# Patient Record
Sex: Female | Born: 1940
Health system: Southern US, Community
[De-identification: ages and names within clinical notes are randomized; demographics above are authoritative.]

## PROBLEM LIST (undated history)

## (undated) DIAGNOSIS — K5900 Constipation, unspecified: Secondary | ICD-10-CM

## (undated) DIAGNOSIS — M549 Dorsalgia, unspecified: Secondary | ICD-10-CM

## (undated) DIAGNOSIS — Z9889 Other specified postprocedural states: Secondary | ICD-10-CM

## (undated) DIAGNOSIS — R350 Frequency of micturition: Secondary | ICD-10-CM

## (undated) DIAGNOSIS — Z8669 Personal history of other diseases of the nervous system and sense organs: Secondary | ICD-10-CM

## (undated) DIAGNOSIS — R112 Nausea with vomiting, unspecified: Secondary | ICD-10-CM

## (undated) DIAGNOSIS — M62838 Other muscle spasm: Secondary | ICD-10-CM

## (undated) DIAGNOSIS — G4700 Insomnia, unspecified: Secondary | ICD-10-CM

## (undated) DIAGNOSIS — G8929 Other chronic pain: Secondary | ICD-10-CM

## (undated) DIAGNOSIS — E785 Hyperlipidemia, unspecified: Secondary | ICD-10-CM

## (undated) DIAGNOSIS — F419 Anxiety disorder, unspecified: Secondary | ICD-10-CM

## (undated) DIAGNOSIS — E039 Hypothyroidism, unspecified: Secondary | ICD-10-CM

## (undated) HISTORY — DX: Anxiety disorder, unspecified: F41.9

## (undated) HISTORY — PX: TUBAL LIGATION: SHX77

## (undated) HISTORY — PX: THYROIDECTOMY, PARTIAL: SHX18

## (undated) HISTORY — PX: COLONOSCOPY: SHX174

---

## 2004-11-01 ENCOUNTER — Ambulatory Visit: Payer: Self-pay | Admitting: Family Medicine

## 2005-11-02 ENCOUNTER — Ambulatory Visit: Payer: Self-pay | Admitting: Family Medicine

## 2006-11-22 ENCOUNTER — Ambulatory Visit: Payer: Self-pay | Admitting: Family Medicine

## 2007-11-27 ENCOUNTER — Ambulatory Visit: Payer: Self-pay | Admitting: Family Medicine

## 2008-02-03 ENCOUNTER — Ambulatory Visit: Payer: Self-pay | Admitting: Family Medicine

## 2008-12-21 ENCOUNTER — Ambulatory Visit: Payer: Self-pay | Admitting: Family Medicine

## 2009-12-23 ENCOUNTER — Ambulatory Visit: Payer: Self-pay

## 2010-05-03 ENCOUNTER — Ambulatory Visit: Payer: Self-pay | Admitting: Gastroenterology

## 2010-12-26 ENCOUNTER — Ambulatory Visit: Payer: Self-pay | Admitting: Family Medicine

## 2012-01-25 ENCOUNTER — Ambulatory Visit: Payer: Self-pay

## 2012-03-07 ENCOUNTER — Emergency Department: Payer: Self-pay | Admitting: *Deleted

## 2012-03-07 LAB — URINALYSIS, COMPLETE
Blood: NEGATIVE
Glucose,UR: NEGATIVE mg/dL (ref 0–75)
Nitrite: NEGATIVE
WBC UR: 6 /HPF (ref 0–5)

## 2012-03-14 ENCOUNTER — Encounter (HOSPITAL_COMMUNITY): Payer: Self-pay | Admitting: Pharmacy Technician

## 2012-03-15 ENCOUNTER — Other Ambulatory Visit: Payer: Self-pay | Admitting: Neurosurgery

## 2012-03-18 ENCOUNTER — Encounter (HOSPITAL_COMMUNITY): Payer: Self-pay | Admitting: *Deleted

## 2012-03-18 MED ORDER — DEXAMETHASONE SODIUM PHOSPHATE 10 MG/ML IJ SOLN
10.0000 mg | INTRAMUSCULAR | Status: AC
  Administered 2012-03-19: 10 mg via INTRAVENOUS
  Filled 2012-03-18: qty 1

## 2012-03-18 MED ORDER — CEFAZOLIN SODIUM 1-5 GM-% IV SOLN
1.0000 g | INTRAVENOUS | Status: AC
  Administered 2012-03-19: 1 g via INTRAVENOUS
  Filled 2012-03-18: qty 50

## 2012-03-18 NOTE — Progress Notes (Signed)
Pt doesn't have a cardiologist  Denies ever having an echo/stress test/ heart cath   Medical MD is Dr.Moffitt with [facility](MD just left and new MD will be Dr.Fitzgerald but pt hasnt even seen him yet)

## 2012-03-19 ENCOUNTER — Ambulatory Visit (HOSPITAL_COMMUNITY): Payer: Medicare Other | Admitting: Certified Registered Nurse Anesthetist

## 2012-03-19 ENCOUNTER — Encounter (HOSPITAL_COMMUNITY): Payer: Self-pay | Admitting: Certified Registered Nurse Anesthetist

## 2012-03-19 ENCOUNTER — Ambulatory Visit (HOSPITAL_COMMUNITY): Payer: Medicare Other

## 2012-03-19 ENCOUNTER — Inpatient Hospital Stay (HOSPITAL_COMMUNITY)
Admission: RE | Admit: 2012-03-19 | Discharge: 2012-03-20 | DRG: 491 | Disposition: A | Discharge status: Home / Self Care | Payer: Medicare Other | Source: Ambulatory Visit | Attending: Neurosurgery | Admitting: Neurosurgery

## 2012-03-19 ENCOUNTER — Encounter (HOSPITAL_COMMUNITY)
Admission: RE | Disposition: A | Discharge status: Home / Self Care | Payer: Self-pay | Source: Ambulatory Visit | Attending: Neurosurgery

## 2012-03-19 ENCOUNTER — Encounter (HOSPITAL_COMMUNITY): Payer: Self-pay | Admitting: *Deleted

## 2012-03-19 DIAGNOSIS — E785 Hyperlipidemia, unspecified: Secondary | ICD-10-CM | POA: Diagnosis present

## 2012-03-19 DIAGNOSIS — Z87891 Personal history of nicotine dependence: Secondary | ICD-10-CM

## 2012-03-19 DIAGNOSIS — M48062 Spinal stenosis, lumbar region with neurogenic claudication: Principal | ICD-10-CM | POA: Diagnosis present

## 2012-03-19 DIAGNOSIS — G4700 Insomnia, unspecified: Secondary | ICD-10-CM | POA: Diagnosis present

## 2012-03-19 DIAGNOSIS — R350 Frequency of micturition: Secondary | ICD-10-CM | POA: Diagnosis present

## 2012-03-19 DIAGNOSIS — E039 Hypothyroidism, unspecified: Secondary | ICD-10-CM | POA: Diagnosis present

## 2012-03-19 HISTORY — DX: Other chronic pain: G89.29

## 2012-03-19 HISTORY — DX: Frequency of micturition: R35.0

## 2012-03-19 HISTORY — DX: Constipation, unspecified: K59.00

## 2012-03-19 HISTORY — DX: Dorsalgia, unspecified: M54.9

## 2012-03-19 HISTORY — PX: LUMBAR LAMINECTOMY/DECOMPRESSION MICRODISCECTOMY: SHX5026

## 2012-03-19 HISTORY — DX: Personal history of other diseases of the nervous system and sense organs: Z86.69

## 2012-03-19 HISTORY — DX: Hypothyroidism, unspecified: E03.9

## 2012-03-19 HISTORY — DX: Nausea with vomiting, unspecified: Z98.890

## 2012-03-19 HISTORY — DX: Hyperlipidemia, unspecified: E78.5

## 2012-03-19 HISTORY — DX: Insomnia, unspecified: G47.00

## 2012-03-19 HISTORY — DX: Other muscle spasm: M62.838

## 2012-03-19 HISTORY — DX: Nausea with vomiting, unspecified: R11.2

## 2012-03-19 LAB — DIFFERENTIAL
Eosinophils Absolute: 0.1 10*3/uL (ref 0.0–0.7)
Eosinophils Relative: 1 % (ref 0–5)
Lymphs Abs: 2.8 10*3/uL (ref 0.7–4.0)
Monocytes Relative: 9 % (ref 3–12)

## 2012-03-19 LAB — BASIC METABOLIC PANEL
BUN: 9 mg/dL (ref 6–23)
Calcium: 9.6 mg/dL (ref 8.4–10.5)
Creatinine, Ser: 0.78 mg/dL (ref 0.50–1.10)
GFR calc Af Amer: >90 mL/min (ref >90)
GFR calc non Af Amer: 83 mL/min — ABNORMAL LOW (ref >90)
Potassium: 4.5 mEq/L (ref 3.5–5.1)

## 2012-03-19 LAB — CBC
MCH: 30.3 pg (ref 26.0–34.0)
MCHC: 33.9 g/dL (ref 30.0–36.0)
MCV: 89.6 fL (ref 78.0–100.0)
Platelets: 274 10*3/uL (ref 150–400)
RDW: 13.5 % (ref 11.5–15.5)

## 2012-03-19 SURGERY — LUMBAR LAMINECTOMY/DECOMPRESSION MICRODISCECTOMY 2 LEVELS
Anesthesia: General | Site: Back | Laterality: Bilateral

## 2012-03-19 MED ORDER — BACITRACIN 50000 UNITS IM SOLR
INTRAMUSCULAR | Status: AC
  Filled 2012-03-19: qty 1

## 2012-03-19 MED ORDER — OXYCODONE-ACETAMINOPHEN 5-325 MG PO TABS: 1.0000 | ORAL_TABLET | ORAL | Status: DC | PRN

## 2012-03-19 MED ORDER — PHENYLEPHRINE HCL 10 MG/ML IJ SOLN
INTRAMUSCULAR | Status: DC | PRN
  Administered 2012-03-19 (×2): 40 ug via INTRAVENOUS

## 2012-03-19 MED ORDER — POLYETHYLENE GLYCOL 3350 17 G PO PACK
17.0000 g | PACK | Freq: Every day | ORAL | Status: DC | PRN
  Filled 2012-03-19: qty 1

## 2012-03-19 MED ORDER — KETOROLAC TROMETHAMINE 30 MG/ML IJ SOLN
INTRAMUSCULAR | Status: DC | PRN
  Administered 2012-03-19: 30 mg via INTRAVENOUS

## 2012-03-19 MED ORDER — MUPIROCIN 2 % EX OINT
TOPICAL_OINTMENT | CUTANEOUS | Status: AC
  Administered 2012-03-19: 1 via NASAL
  Filled 2012-03-19: qty 22

## 2012-03-19 MED ORDER — SODIUM CHLORIDE 0.9 % IJ SOLN
3.0000 mL | Freq: Two times a day (BID) | INTRAMUSCULAR | Status: DC
  Administered 2012-03-19: 3 mL via INTRAVENOUS

## 2012-03-19 MED ORDER — KETOROLAC TROMETHAMINE 30 MG/ML IJ SOLN
30.0000 mg | Freq: Four times a day (QID) | INTRAMUSCULAR | Status: DC
  Administered 2012-03-19 – 2012-03-20 (×2): 30 mg via INTRAVENOUS
  Filled 2012-03-19 (×6): qty 1

## 2012-03-19 MED ORDER — VECURONIUM BROMIDE 10 MG IV SOLR
INTRAVENOUS | Status: DC | PRN
  Administered 2012-03-19: 2 mg via INTRAVENOUS

## 2012-03-19 MED ORDER — HYDROMORPHONE HCL PF 1 MG/ML IJ SOLN
INTRAMUSCULAR | Status: AC
  Administered 2012-03-19: 0.5 mg via INTRAVENOUS
  Filled 2012-03-19: qty 1

## 2012-03-19 MED ORDER — ONDANSETRON HCL 4 MG/2ML IJ SOLN
INTRAMUSCULAR | Status: DC | PRN
  Administered 2012-03-19: 4 mg via INTRAVENOUS

## 2012-03-19 MED ORDER — METHOCARBAMOL 500 MG PO TABS: 500.0000 mg | ORAL_TABLET | Freq: Three times a day (TID) | ORAL | Status: DC | PRN

## 2012-03-19 MED ORDER — FENTANYL CITRATE 0.05 MG/ML IJ SOLN
INTRAMUSCULAR | Status: DC | PRN
  Administered 2012-03-19 (×2): 50 ug via INTRAVENOUS
  Administered 2012-03-19: 100 ug via INTRAVENOUS
  Administered 2012-03-19: 50 ug via INTRAVENOUS

## 2012-03-19 MED ORDER — LIDOCAINE HCL (CARDIAC) 20 MG/ML IV SOLN
INTRAVENOUS | Status: DC | PRN
  Administered 2012-03-19: 70 mg via INTRAVENOUS
  Administered 2012-03-19: 30 mg via INTRAVENOUS

## 2012-03-19 MED ORDER — SODIUM CHLORIDE 0.9 % IR SOLN
Status: DC | PRN
  Administered 2012-03-19: 16:00:00

## 2012-03-19 MED ORDER — MELOXICAM 7.5 MG PO TABS: 7.5000 mg | ORAL_TABLET | Freq: Every day | ORAL | Status: DC

## 2012-03-19 MED ORDER — BISACODYL 10 MG RE SUPP: 10.0000 mg | Freq: Every day | RECTAL | Status: DC | PRN

## 2012-03-19 MED ORDER — THROMBIN 5000 UNITS EX KIT
PACK | CUTANEOUS | Status: DC | PRN
  Administered 2012-03-19 (×2): 5000 [IU] via TOPICAL

## 2012-03-19 MED ORDER — CYCLOBENZAPRINE HCL 10 MG PO TABS: 10.0000 mg | ORAL_TABLET | Freq: Three times a day (TID) | ORAL | Status: DC | PRN

## 2012-03-19 MED ORDER — CEFAZOLIN SODIUM 1-5 GM-% IV SOLN
1.0000 g | Freq: Three times a day (TID) | INTRAVENOUS | Status: AC
  Administered 2012-03-19 – 2012-03-20 (×2): 1 g via INTRAVENOUS
  Filled 2012-03-19 (×3): qty 50

## 2012-03-19 MED ORDER — HYDROCODONE-ACETAMINOPHEN 5-325 MG PO TABS: 1.0000 | ORAL_TABLET | ORAL | Status: DC | PRN

## 2012-03-19 MED ORDER — SENNA 8.6 MG PO TABS
1.0000 | ORAL_TABLET | Freq: Two times a day (BID) | ORAL | Status: DC
  Administered 2012-03-19: 8.6 mg via ORAL
  Filled 2012-03-19 (×3): qty 1

## 2012-03-19 MED ORDER — SODIUM CHLORIDE 0.9 % IV SOLN: 250.0000 mL | INTRAVENOUS | Status: DC

## 2012-03-19 MED ORDER — HEMOSTATIC AGENTS (NO CHARGE) OPTIME
TOPICAL | Status: DC | PRN
  Administered 2012-03-19: 1 via TOPICAL

## 2012-03-19 MED ORDER — ZOLPIDEM TARTRATE 5 MG PO TABS: 5.0000 mg | ORAL_TABLET | Freq: Every evening | ORAL | Status: DC | PRN

## 2012-03-19 MED ORDER — HYDROMORPHONE HCL PF 1 MG/ML IJ SOLN: 0.5000 mg | INTRAMUSCULAR | Status: DC | PRN

## 2012-03-19 MED ORDER — PROPOFOL 10 MG/ML IV EMUL
INTRAVENOUS | Status: DC | PRN
  Administered 2012-03-19: 10 mg via INTRAVENOUS
  Administered 2012-03-19: 150 mg via INTRAVENOUS

## 2012-03-19 MED ORDER — ACETAMINOPHEN 650 MG RE SUPP: 650.0000 mg | RECTAL | Status: DC | PRN

## 2012-03-19 MED ORDER — SIMVASTATIN 20 MG PO TABS
20.0000 mg | ORAL_TABLET | Freq: Every evening | ORAL | Status: DC
  Administered 2012-03-19: 20 mg via ORAL
  Filled 2012-03-19 (×3): qty 1

## 2012-03-19 MED ORDER — 0.9 % SODIUM CHLORIDE (POUR BTL) OPTIME
TOPICAL | Status: DC | PRN
  Administered 2012-03-19: 1000 mL

## 2012-03-19 MED ORDER — ACETAMINOPHEN 325 MG PO TABS: 650.0000 mg | ORAL_TABLET | ORAL | Status: DC | PRN

## 2012-03-19 MED ORDER — PHENOL 1.4 % MT LIQD: 1.0000 | OROMUCOSAL | Status: DC | PRN

## 2012-03-19 MED ORDER — LEVOTHYROXINE SODIUM 88 MCG PO TABS
88.0000 ug | ORAL_TABLET | Freq: Every day | ORAL | Status: DC
  Administered 2012-03-20: 88 ug via ORAL
  Filled 2012-03-19 (×2): qty 1

## 2012-03-19 MED ORDER — ONDANSETRON HCL 4 MG/2ML IJ SOLN: 4.0000 mg | INTRAMUSCULAR | Status: DC | PRN

## 2012-03-19 MED ORDER — ROCURONIUM BROMIDE 100 MG/10ML IV SOLN
INTRAVENOUS | Status: DC | PRN
  Administered 2012-03-19: 50 mg via INTRAVENOUS

## 2012-03-19 MED ORDER — SODIUM CHLORIDE 0.9 % IV SOLN
INTRAVENOUS | Status: AC
  Filled 2012-03-19: qty 500

## 2012-03-19 MED ORDER — MIDAZOLAM HCL 5 MG/5ML IJ SOLN
INTRAMUSCULAR | Status: DC | PRN
  Administered 2012-03-19: 0.5 mg via INTRAVENOUS
  Administered 2012-03-19: 1 mg via INTRAVENOUS

## 2012-03-19 MED ORDER — HYDROMORPHONE HCL PF 1 MG/ML IJ SOLN
0.2500 mg | INTRAMUSCULAR | Status: DC | PRN
  Administered 2012-03-19 (×3): 0.5 mg via INTRAVENOUS

## 2012-03-19 MED ORDER — FLEET ENEMA 7-19 GM/118ML RE ENEM
1.0000 | ENEMA | Freq: Once | RECTAL | Status: AC | PRN
  Filled 2012-03-19: qty 1

## 2012-03-19 MED ORDER — LACTATED RINGERS IV SOLN
INTRAVENOUS | Status: DC | PRN
  Administered 2012-03-19 (×2): via INTRAVENOUS

## 2012-03-19 MED ORDER — SODIUM CHLORIDE 0.9 % IJ SOLN: 3.0000 mL | INTRAMUSCULAR | Status: DC | PRN

## 2012-03-19 MED ORDER — ALUM & MAG HYDROXIDE-SIMETH 200-200-20 MG/5ML PO SUSP: 30.0000 mL | Freq: Four times a day (QID) | ORAL | Status: DC | PRN

## 2012-03-19 MED ORDER — MENTHOL 3 MG MT LOZG: 1.0000 | LOZENGE | OROMUCOSAL | Status: DC | PRN

## 2012-03-19 MED ORDER — BUPIVACAINE HCL (PF) 0.25 % IJ SOLN
INTRAMUSCULAR | Status: DC | PRN
  Administered 2012-03-19: 20 mL

## 2012-03-19 MED ORDER — NEOSTIGMINE METHYLSULFATE 1 MG/ML IJ SOLN
INTRAMUSCULAR | Status: DC | PRN
  Administered 2012-03-19: 3 mg via INTRAVENOUS

## 2012-03-19 MED ORDER — ONDANSETRON HCL 4 MG/2ML IJ SOLN: 4.0000 mg | Freq: Four times a day (QID) | INTRAMUSCULAR | Status: DC | PRN

## 2012-03-19 MED ORDER — GLYCOPYRROLATE 0.2 MG/ML IJ SOLN
INTRAMUSCULAR | Status: DC | PRN
  Administered 2012-03-19: .5 mg via INTRAVENOUS

## 2012-03-19 SURGICAL SUPPLY — 46 items
BAG DECANTER FOR FLEXI CONT (MISCELLANEOUS) ×2 IMPLANT
BENZOIN TINCTURE PRP APPL 2/3 (GAUZE/BANDAGES/DRESSINGS) ×2 IMPLANT
BLADE SURG ROTATE 9660 (MISCELLANEOUS) IMPLANT
BRUSH SCRUB EZ PLAIN DRY (MISCELLANEOUS) ×2 IMPLANT
BUR CUTTER 7.0 ROUND (BURR) ×2 IMPLANT
CANISTER SUCTION 2500CC (MISCELLANEOUS) ×2 IMPLANT
CLOTH BEACON ORANGE TIMEOUT ST (SAFETY) ×2 IMPLANT
CONT SPEC 4OZ CLIKSEAL STRL BL (MISCELLANEOUS) ×2 IMPLANT
DECANTER SPIKE VIAL GLASS SM (MISCELLANEOUS) ×2 IMPLANT
DERMABOND ADVANCED (GAUZE/BANDAGES/DRESSINGS) ×1
DERMABOND ADVANCED .7 DNX12 (GAUZE/BANDAGES/DRESSINGS) ×1 IMPLANT
DRAPE LAPAROTOMY 100X72X124 (DRAPES) ×2 IMPLANT
DRAPE MICROSCOPE ZEISS OPMI (DRAPES) ×2 IMPLANT
DRAPE POUCH INSTRU U-SHP 10X18 (DRAPES) ×2 IMPLANT
DRAPE PROXIMA HALF (DRAPES) IMPLANT
DRAPE SURG 17X23 STRL (DRAPES) ×4 IMPLANT
ELECT REM PT RETURN 9FT ADLT (ELECTROSURGICAL) ×2
ELECTRODE REM PT RTRN 9FT ADLT (ELECTROSURGICAL) ×1 IMPLANT
EVACUATOR 1/8 PVC DRAIN (DRAIN) ×2 IMPLANT
GAUZE SPONGE 4X4 16PLY XRAY LF (GAUZE/BANDAGES/DRESSINGS) IMPLANT
GLOVE ECLIPSE 8.5 STRL (GLOVE) ×2 IMPLANT
GLOVE EXAM NITRILE LRG STRL (GLOVE) IMPLANT
GLOVE EXAM NITRILE MD LF STRL (GLOVE) IMPLANT
GLOVE EXAM NITRILE XL STR (GLOVE) IMPLANT
GLOVE EXAM NITRILE XS STR PU (GLOVE) IMPLANT
GOWN BRE IMP SLV AUR LG STRL (GOWN DISPOSABLE) IMPLANT
GOWN BRE IMP SLV AUR XL STRL (GOWN DISPOSABLE) ×2 IMPLANT
GOWN STRL REIN 2XL LVL4 (GOWN DISPOSABLE) IMPLANT
KIT BASIN OR (CUSTOM PROCEDURE TRAY) ×2 IMPLANT
KIT ROOM TURNOVER OR (KITS) ×2 IMPLANT
NEEDLE HYPO 22GX1.5 SAFETY (NEEDLE) ×2 IMPLANT
NEEDLE SPNL 22GX3.5 QUINCKE BK (NEEDLE) ×2 IMPLANT
NS IRRIG 1000ML POUR BTL (IV SOLUTION) ×2 IMPLANT
PACK LAMINECTOMY NEURO (CUSTOM PROCEDURE TRAY) ×2 IMPLANT
PAD ARMBOARD 7.5X6 YLW CONV (MISCELLANEOUS) ×6 IMPLANT
RUBBERBAND STERILE (MISCELLANEOUS) ×4 IMPLANT
SPONGE GAUZE 4X4 12PLY (GAUZE/BANDAGES/DRESSINGS) ×2 IMPLANT
SPONGE SURGIFOAM ABS GEL SZ50 (HEMOSTASIS) ×2 IMPLANT
STRIP CLOSURE SKIN 1/2X4 (GAUZE/BANDAGES/DRESSINGS) ×2 IMPLANT
SUT VIC AB 2-0 CT1 18 (SUTURE) ×4 IMPLANT
SUT VIC AB 3-0 SH 8-18 (SUTURE) ×2 IMPLANT
SYR 20ML ECCENTRIC (SYRINGE) ×2 IMPLANT
TAPE CLOTH SURG 4X10 WHT LF (GAUZE/BANDAGES/DRESSINGS) ×2 IMPLANT
TOWEL OR 17X24 6PK STRL BLUE (TOWEL DISPOSABLE) ×2 IMPLANT
TOWEL OR 17X26 10 PK STRL BLUE (TOWEL DISPOSABLE) ×2 IMPLANT
WATER STERILE IRR 1000ML POUR (IV SOLUTION) ×2 IMPLANT

## 2012-03-19 NOTE — Anesthesia Postprocedure Evaluation (Signed)
  Anesthesia Post-op Note  Patient: Vanessa Vaughn  Procedure(s) Performed: Procedure(s) (LRB): LUMBAR LAMINECTOMY/DECOMPRESSION MICRODISCECTOMY 2 LEVELS (Bilateral)  Patient Location: PACU  Anesthesia Type: General  Level of Consciousness: awake, alert  and oriented  Airway and Oxygen Therapy: Patient Spontanous Breathing and Patient connected to nasal cannula oxygen  Post-op Pain: none  Post-op Assessment: Post-op Vital signs reviewed, Patient's Cardiovascular Status Stable, Respiratory Function Stable, Patent Airway, No signs of Nausea or vomiting and Pain level controlled  Post-op Vital Signs: Reviewed and stable  Complications: No apparent anesthesia complications

## 2012-03-19 NOTE — Preoperative (Signed)
Beta Blockers   Reason not to administer Beta Blockers:Not Applicable 

## 2012-03-19 NOTE — Transfer of Care (Signed)
Immediate Anesthesia Transfer of Care Note  Patient: Vanessa Vaughn  Procedure(s) Performed: Procedure(s) (LRB): LUMBAR LAMINECTOMY/DECOMPRESSION MICRODISCECTOMY 2 LEVELS (Bilateral)  Patient Location: PACU  Anesthesia Type: General  Level of Consciousness: awake, oriented and patient cooperative  Airway & Oxygen Therapy: Patient Spontanous Breathing and Patient connected to nasal cannula oxygen  Post-op Assessment: Report given to PACU RN, Post -op Vital signs reviewed and stable and Patient moving all extremities X 4  Post vital signs: Reviewed and stable  Complications: No apparent anesthesia complications

## 2012-03-19 NOTE — Brief Op Note (Signed)
03/19/2012  5:30 PM  PATIENT:  Vanessa Vaughn  71 y.o. female  PRE-OPERATIVE DIAGNOSIS:  stenosis  POST-OPERATIVE DIAGNOSIS:  stenosis  PROCEDURE:  Procedure(s) (LRB): LUMBAR LAMINECTOMY/DECOMPRESSION MICRODISCECTOMY 2 LEVELS (Bilateral)  SURGEON:  Surgeon(s) and Role:    * Temple Pacini, MD - Primary    * Barnett Abu, MD - Assisting  PHYSICIAN ASSISTANT:   ASSISTANTS:    ANESTHESIA:   general  EBL:  Total I/O In: 800 [I.V.:800] Out: 50 [Blood:50]  BLOOD ADMINISTERED:none  DRAINS: (Medium) Hemovact drain(s) in the Epidural space with  Suction Open   LOCAL MEDICATIONS USED:  MARCAINE     SPECIMEN:  No Specimen  DISPOSITION OF SPECIMEN:  N/A  COUNTS:  YES  TOURNIQUET:  * No tourniquets in log *  DICTATION: .Dragon Dictation  PLAN OF CARE: Admit to inpatient   PATIENT DISPOSITION:  PACU - hemodynamically stable.   Delay start of Pharmacological VTE agent (>24hrs) due to surgical blood loss or risk of bleeding: yes

## 2012-03-19 NOTE — Anesthesia Preprocedure Evaluation (Signed)
Anesthesia Evaluation  Patient identified by MRN, date of birth, ID band Patient awake    Reviewed: Allergy & Precautions, H&P , NPO status , Patient's Chart, lab work & pertinent test results  History of Anesthesia Complications (+) PONV  Airway Mallampati: II  Neck ROM: full    Dental   Pulmonary          Cardiovascular     Neuro/Psych    GI/Hepatic   Endo/Other  Hypothyroidism   Renal/GU      Musculoskeletal   Abdominal   Peds  Hematology   Anesthesia Other Findings   Reproductive/Obstetrics                           Anesthesia Physical Anesthesia Plan  ASA: II  Anesthesia Plan: General   Post-op Pain Management:    Induction: Intravenous  Airway Management Planned: Oral ETT  Additional Equipment:   Intra-op Plan:   Post-operative Plan: Extubation in OR  Informed Consent: I have reviewed the patients History and Physical, chart, labs and discussed the procedure including the risks, benefits and alternatives for the proposed anesthesia with the patient or authorized representative who has indicated his/her understanding and acceptance.     Plan Discussed with: CRNA and Surgeon  Anesthesia Plan Comments:         Anesthesia Quick Evaluation

## 2012-03-19 NOTE — H&P (Signed)
Vanessa Vaughn is an 71 y.o. female.   Chief Complaint: Bilateral lower extremity pain HPI: 71 year old female with severe back and bilateral lower extremity symptoms consistent with neurogenic claudication. Workup demonstrates evidence of critical stenosis at L2-3 and severe stenosis at L3-4. Patient's failed conservative management and presents now for decompressive surgery in hopes of improving her symptoms.  Past Medical History  Diagnosis Date  . PONV (postoperative nausea and vomiting)   . Hyperlipidemia     takes Simvastatin daily  . Hx of migraines     5+yrs ago;pt states caused by Lipitor  . Muscle spasm     takes Robaxin prn  . Chronic back pain     stenosis  . Constipation   . Urinary frequency     pt states related to meds  . Hypothyroidism     takes Synthroid daily  . Insomnia     takes Ambien prn    Past Surgical History  Procedure Date  . Thyroidectomy, partial   . Tubal ligation   . Colonoscopy     Family History  Problem Relation Age of Onset  . Anesthesia problems Neg Hx   . Hypotension Neg Hx   . Malignant hyperthermia Neg Hx   . Pseudochol deficiency Neg Hx    Social History:  reports that she has quit smoking. She does not have any smokeless tobacco history on file. She reports that she drinks alcohol. She reports that she does not use illicit drugs.  Allergies: No Known Allergies  Medications Prior to Admission  Medication Sig Dispense Refill  . levothyroxine (SYNTHROID, LEVOTHROID) 88 MCG tablet Take 88 mcg by mouth daily.      . meloxicam (MOBIC) 7.5 MG tablet Take 7.5 mg by mouth daily.      . methocarbamol (ROBAXIN) 500 MG tablet Take 500 mg by mouth 3 (three) times daily as needed. For muscle spasms      . oxyCODONE-acetaminophen (PERCOCET) 5-325 MG per tablet Take 1 tablet by mouth every 4 (four) hours as needed. For pain      . simvastatin (ZOCOR) 20 MG tablet Take 20 mg by mouth every evening.      . zolpidem (AMBIEN) 5 MG tablet Take 5  mg by mouth at bedtime as needed. For sleep        Results for orders placed during the hospital encounter of 03/19/12 (from the past 48 hour(s))  BASIC METABOLIC PANEL     Status: Abnormal   Collection Time   03/19/12  1:06 PM      Component Value Range Comment   Sodium 137  135 - 145 (mEq/L)    Potassium 4.5  3.5 - 5.1 (mEq/L)    Chloride 99  96 - 112 (mEq/L)    CO2 26  19 - 32 (mEq/L)    Glucose, Bld 100 (*) 70 - 99 (mg/dL)    BUN 9  6 - 23 (mg/dL)    Creatinine, Ser 1.61  0.50 - 1.10 (mg/dL)    Calcium 9.6  8.4 - 10.5 (mg/dL)    GFR calc non Af Amer 83 (*) >90 (mL/min)    GFR calc Af Amer >90  >90 (mL/min)   CBC     Status: Normal   Collection Time   03/19/12  1:06 PM      Component Value Range Comment   WBC 8.4  4.0 - 10.5 (K/uL)    RBC 4.22  3.87 - 5.11 (MIL/uL)    Hemoglobin 12.8  12.0 - 15.0 (g/dL)    HCT 40.9  81.1 - 91.4 (%)    MCV 89.6  78.0 - 100.0 (fL)    MCH 30.3  26.0 - 34.0 (pg)    MCHC 33.9  30.0 - 36.0 (g/dL)    RDW 78.2  95.6 - 21.3 (%)    Platelets 274  150 - 400 (K/uL)   DIFFERENTIAL     Status: Normal   Collection Time   03/19/12  1:06 PM      Component Value Range Comment   Neutrophils Relative 57  43 - 77 (%)    Neutro Abs 4.7  1.7 - 7.7 (K/uL)    Lymphocytes Relative 33  12 - 46 (%)    Lymphs Abs 2.8  0.7 - 4.0 (K/uL)    Monocytes Relative 9  3 - 12 (%)    Monocytes Absolute 0.7  0.1 - 1.0 (K/uL)    Eosinophils Relative 1  0 - 5 (%)    Eosinophils Absolute 0.1  0.0 - 0.7 (K/uL)    Basophils Relative 1  0 - 1 (%)    Basophils Absolute 0.1  0.0 - 0.1 (K/uL)   TYPE AND SCREEN     Status: Normal   Collection Time   03/19/12  1:10 PM      Component Value Range Comment   ABO/RH(D) A POS      Antibody Screen NEG      Sample Expiration 03/22/2012     ABO/RH     Status: Normal   Collection Time   03/19/12  1:10 PM      Component Value Range Comment   ABO/RH(D) A POS      Dg Chest 2 View  03/19/2012  *RADIOLOGY REPORT*  Clinical Data: Preoperative  respiratory films.  CHEST - 2 VIEW  Comparison: None.  Findings: Lungs are clear.  Heart size is normal.  No pneumothorax or pleural fluid.  Multilevel thoracic degenerative change noted.  IMPRESSION: No acute finding.  Original Report Authenticated By: Bernadene Bell. Maricela Curet, M.D.    Review of Systems  Constitutional: Negative.   HENT: Negative.   Eyes: Negative.   Respiratory: Negative.   Cardiovascular: Negative.   Gastrointestinal: Negative.   Genitourinary: Negative.   Musculoskeletal: Negative.   Skin: Negative.   Neurological: Negative.   Endo/Heme/Allergies: Negative.   Psychiatric/Behavioral: Negative.     Blood pressure 136/75, pulse 90, temperature 99.3 F (37.4 C), temperature source Oral, resp. rate 20, height 5\' 4"  (1.626 m), weight 59.875 kg (132 lb), SpO2 95.00%. Physical Exam  Constitutional: She is oriented to person, place, and time. She appears well-developed and well-nourished.  HENT:  Head: Normocephalic and atraumatic.  Right Ear: External ear normal.  Left Ear: External ear normal.  Nose: Nose normal.  Mouth/Throat: Oropharynx is clear and moist.  Eyes: Conjunctivae and EOM are normal. Pupils are equal, round, and reactive to light.  Neck: Normal range of motion. Neck supple. No tracheal deviation present. No thyromegaly present.  Cardiovascular: Normal rate, regular rhythm, normal heart sounds and intact distal pulses.   Respiratory: Breath sounds normal. No respiratory distress. She has no wheezes.  GI: Soft. Bowel sounds are normal. She exhibits no distension. There is no tenderness. There is no rebound.  Musculoskeletal: Normal range of motion. She exhibits no edema and no tenderness.  Neurological: She is alert and oriented to person, place, and time. She has normal reflexes. She displays normal reflexes. No cranial nerve deficit. She exhibits normal muscle  tone. Coordination normal.  Skin: Skin is warm and dry. No erythema. No pallor.  Psychiatric: She  has a normal mood and affect. Her behavior is normal. Judgment and thought content normal.     Assessment/Plan  L2-3 L3-4 with neurogenic claudication. Plan bilateral L2-3 and L3-4 decompressive laminotomies and foraminotomies. Risks and benefits been explained. Patient wishes to proceed.  Haruna Rohlfs A 03/19/2012, 2:51 PM

## 2012-03-19 NOTE — Op Note (Signed)
Date of procedure: 03/19/2012  Date of dictation: Same  Service: Neurosurgery  Preoperative diagnosis: L2-3 L3-4 stenosis with neurogenic claudication  Postoperative diagnosis: Same  Procedure Name: Bilateral L2-3 and L3-4 decompressive laminotomies with bilateral L2-L3 and L4 decompressive foraminotomies.  Surgeon:Hellon Vaccarella A.Marqueta Pulley, M.D.  Asst. Surgeon: Barnett Abu  Anesthesia: General  Indication: 71 year old with severe back and bilateral lower extremity pain consistent with neurogenic claudication. Workup demonstrates evidence of severe stenosis at L2-3 and moderately severe stenosis at L3-4. Patient presents now for decompressive surgery in hopes of improving her symptoms.  Operative note: After Anesthesia, patient positioned prone on the Wilson frame and appropriate padded. Patient's lumbar region prepped and draped sterilely. Incision was made lamina exposed retractor placed x-ray taken levels confirmed. Decompressive laminotomies performed bilaterally at L2-3 and L3-4 utilizing high-speed drill Kerrison rongeurs. Ligamentum flavum elevated and resected piecemeal fashion. Wide decompressive foraminotomies form of course exiting L2-L3 and L4 nerve roots bilaterally. No evidence of injury to thecal sac and nerve roots. No evidence of residual stenosis. Wound irrigated. Hemostasis obtained. Hemovac drain placed. Wound closed in typical fashion. No apparent complications. Patient returns they're covering postop.

## 2012-03-20 ENCOUNTER — Encounter (HOSPITAL_COMMUNITY): Payer: Self-pay | Admitting: Neurosurgery

## 2012-03-20 MED ORDER — CYCLOBENZAPRINE HCL 10 MG PO TABS: 10.0000 mg | ORAL_TABLET | Freq: Three times a day (TID) | ORAL | Status: AC | PRN

## 2012-03-20 MED ORDER — HYDROCODONE-ACETAMINOPHEN 5-325 MG PO TABS: 1.0000 | ORAL_TABLET | ORAL | Status: AC | PRN

## 2012-03-20 NOTE — Discharge Instructions (Signed)
Wound Care Keep incision covered and dry for one week.  If you shower prior to then, cover incision with plastic wrap.  You may remove outer bandage after one week and shower.  Do not put any creams, lotions, or ointments on incision. Leave steri-strips on back.  They will fall off by themselves. ActivityWalk each and every day, increasing distance each day.  No lifting greater than 5 lbs.  Avoid excessive neck motion. No driving for 2 weeks; may ride as a passenger locally. Diet Resume your normal diet.  Return to Work Will be discussed at you follow up appointment. Call Your Doctor If Any of These Occur Redness, drainage, or swelling at the wound.  Temperature greater than 101 degrees. Severe pain not relieved by pain medication. Incision starts to come apart. Follow Up Appt Call today for appointment in 1-2 weeks ([phone]) or for problems.  If you have any hardware placed in your spine, you will need an x-ray before your appointment. 

## 2012-03-20 NOTE — Discharge Summary (Signed)
Physician Discharge Summary  Patient ID: Vanessa Vaughn MRN: [id number] DOB/AGE: 1941-05-08 71 y.o.  Admit date: 03/19/2012 Discharge date: 03/20/2012  Admission Diagnoses:  Discharge Diagnoses:  Principal Problem:  *Lumbar stenosis with neurogenic claudication   Discharged Condition: good  Hospital Course: Admitted the hospital where she underwent an uncomplicated 2 level bilateral lumbar decompressive laminotomies and foraminotomies. Postoperative she is done well. Preoperative lower extremity pain has resolved. Strength cessation intact. Wound healing well. Plan for discharge home.  Consults:   Significant Diagnostic Studies:   Treatments:   Discharge Exam: Blood pressure 104/47, pulse 67, temperature 98.7 F (37.1 C), temperature source Oral, resp. rate 18, height 5\' 4"  (1.626 m), weight 59.875 kg (132 lb), SpO2 100.00%. Patient awake and alert oriented and appropriate. Cranial nerve function is intact. Motor and sensory function extremities is normal. Wound clean dry and intact. Chest and abdomen benign.  Disposition: Final discharge disposition not confirmed   Medication List  As of 03/20/2012  8:08 AM   STOP taking these medications         oxyCODONE-acetaminophen 5-325 MG per tablet         TAKE these medications         cyclobenzaprine 10 MG tablet   Commonly known as: FLEXERIL   Take 1 tablet (10 mg total) by mouth 3 (three) times daily as needed for muscle spasms.      HYDROcodone-acetaminophen 5-325 MG per tablet   Commonly known as: NORCO   Take 1-2 tablets by mouth every 4 (four) hours as needed for pain.      levothyroxine 88 MCG tablet   Commonly known as: SYNTHROID, LEVOTHROID   Take 88 mcg by mouth daily.      meloxicam 7.5 MG tablet   Commonly known as: MOBIC   Take 7.5 mg by mouth daily.      methocarbamol 500 MG tablet   Commonly known as: ROBAXIN   Take 500 mg by mouth 3 (three) times daily as needed. For muscle spasms      simvastatin  20 MG tablet   Commonly known as: ZOCOR   Take 20 mg by mouth every evening.      zolpidem 5 MG tablet   Commonly known as: AMBIEN   Take 5 mg by mouth at bedtime as needed. For sleep           Follow-up Information    Follow up with Isiac Breighner A, MD. Call in 1 week. (Ask for crystal)    Contact information:   [address] [place] [address] [phone]          Signed: Temple Pacini 03/20/2012, 8:08 AM

## 2012-03-20 NOTE — Progress Notes (Signed)
UR COMPLETED  

## 2012-03-20 NOTE — Plan of Care (Signed)
Problem: Consults Goal: Diagnosis - Spinal Surgery Outcome: Completed/Met Date Met:  03/20/12 Lumbar Laminectomy (Complex)

## 2012-07-30 ENCOUNTER — Ambulatory Visit: Payer: Self-pay

## 2013-07-31 ENCOUNTER — Ambulatory Visit: Payer: Self-pay

## 2013-08-26 ENCOUNTER — Ambulatory Visit: Payer: Self-pay

## 2013-12-09 ENCOUNTER — Emergency Department: Payer: Self-pay | Admitting: Emergency Medicine

## 2014-03-31 ENCOUNTER — Ambulatory Visit: Payer: Self-pay

## 2015-02-26 DIAGNOSIS — R928 Other abnormal and inconclusive findings on diagnostic imaging of breast: Secondary | ICD-10-CM | POA: Insufficient documentation

## 2015-02-26 DIAGNOSIS — M25519 Pain in unspecified shoulder: Secondary | ICD-10-CM | POA: Insufficient documentation

## 2015-02-26 DIAGNOSIS — D649 Anemia, unspecified: Secondary | ICD-10-CM | POA: Insufficient documentation

## 2015-03-02 ENCOUNTER — Other Ambulatory Visit: Payer: Self-pay | Admitting: Infectious Diseases

## 2015-03-02 DIAGNOSIS — R928 Other abnormal and inconclusive findings on diagnostic imaging of breast: Secondary | ICD-10-CM

## 2015-03-02 DIAGNOSIS — Z0000 Encounter for general adult medical examination without abnormal findings: Secondary | ICD-10-CM

## 2015-04-02 ENCOUNTER — Ambulatory Visit: Payer: 59

## 2015-04-02 ENCOUNTER — Other Ambulatory Visit: Payer: Self-pay | Admitting: Infectious Diseases

## 2015-04-02 ENCOUNTER — Ambulatory Visit
Admission: RE | Admit: 2015-04-02 | Discharge: 2015-04-02 | Disposition: A | Discharge status: Home / Self Care | Payer: Medicare Other | Source: Ambulatory Visit | Attending: Infectious Diseases | Admitting: Infectious Diseases

## 2015-04-02 DIAGNOSIS — Z0000 Encounter for general adult medical examination without abnormal findings: Secondary | ICD-10-CM | POA: Insufficient documentation

## 2015-04-02 DIAGNOSIS — N63 Unspecified lump in breast: Secondary | ICD-10-CM | POA: Diagnosis not present

## 2015-04-02 DIAGNOSIS — R928 Other abnormal and inconclusive findings on diagnostic imaging of breast: Secondary | ICD-10-CM

## 2015-09-23 ENCOUNTER — Ambulatory Visit (INDEPENDENT_AMBULATORY_CARE_PROVIDER_SITE_OTHER): Payer: Medicare Other | Admitting: Licensed Clinical Social Worker

## 2015-09-23 DIAGNOSIS — F411 Generalized anxiety disorder: Secondary | ICD-10-CM
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
## 2015-09-29 ENCOUNTER — Encounter: Payer: Self-pay | Admitting: Psychiatry

## 2015-09-29 ENCOUNTER — Ambulatory Visit (INDEPENDENT_AMBULATORY_CARE_PROVIDER_SITE_OTHER): Payer: Medicare Other | Admitting: Psychiatry

## 2015-09-29 VITALS — BP 138/84 | HR 75 | Temp 98.6°F | Ht 63.5 in | Wt 147.8 lb

## 2015-09-29 DIAGNOSIS — F429 Obsessive-compulsive disorder, unspecified: Secondary | ICD-10-CM | POA: Diagnosis not present

## 2015-09-29 DIAGNOSIS — E039 Hypothyroidism, unspecified: Secondary | ICD-10-CM | POA: Insufficient documentation

## 2015-09-29 DIAGNOSIS — M542 Cervicalgia: Secondary | ICD-10-CM | POA: Insufficient documentation

## 2015-09-29 DIAGNOSIS — E785 Hyperlipidemia, unspecified: Secondary | ICD-10-CM | POA: Insufficient documentation

## 2015-09-29 MED ORDER — SERTRALINE HCL 50 MG PO TABS: ORAL_TABLET | ORAL | Status: DC

## 2015-09-29 NOTE — Progress Notes (Signed)
Psychiatric Initial Adult Assessment   Patient Identification: Vanessa Vaughn MRN:  [id number] Date of Evaluation:  09/29/2015 Referral Source: Primary care physician Chief Complaint:   "Constant twitching" and "constant humming" Chief Complaint    Establish Care; Other     Visit Diagnosis:    ICD-9-CM ICD-10-CM   1. OCD (obsessive compulsive disorder) 300.3 F42.9    Diagnosis:   Patient Active Problem List   Diagnosis Date Noted  . Cervical pain [M54.2] 09/29/2015  . HLD (hyperlipidemia) [E78.5] 09/29/2015  . Adult hypothyroidism [E03.9] 09/29/2015  . Abnormal finding on mammography [R92.8] 02/26/2015  . Pain in shoulder [M25.519] 02/26/2015  . Absolute anemia [D64.9] 02/26/2015  . Lumbar stenosis with neurogenic claudication [M48.06] 03/19/2012   History of Present Illness:  Patient stated that she did have she'll tics as a child. She states that she then grew out of these facial tics in her teenage years. She states that the most recent issues with facial twitching in her nose started 11 years ago. She states that her constant need to harm likely started at least as far back as 2013. She states that the vomiting is her humming a certain song. She states sometimes she has to switch from one song to another. She does deny any anxiety when she's not able to do these things but does state that she feels compelled to do it.  He denies any symptoms with a major depressive disorder. She states there was some depression during a marriage but denied any symptoms that would rise to the level of major depressive episode. She denies suicidal ideation or any past suicide attempts. She denies symptoms of mania. She denies symptoms of any psychotic disorder.  She saw a psychiatrist in 1996 for about one year and received Valium which patient states she never really took on a regular basis. She has a prescription of Ambien which she's takes very rarely. Otherwise she denies any other medication  trials for her symptoms. Elements:  Duration:  as noted above. Associated Signs/Symptoms: Depression Symptoms:  None past or presently. She did report having depressed mood during a difficult marriage years ago (Hypo) Manic Symptoms:  None Anxiety Symptoms:  Obsessive Compulsive Symptoms:   Humming and nose twitching, Psychotic Symptoms:  None PTSD Symptoms: NA Patient reports no psychiatric hospitalizations. No past suicide attempts. Treatment in 1996 which she states was largely therapy with a prescription for Valium which she did not take on a regular basis. Past Medical History:  Past Medical History  Diagnosis Date  . PONV (postoperative nausea and vomiting)   . Hyperlipidemia     takes Simvastatin daily  . Hx of migraines     5+yrs ago;pt states caused by Lipitor  . Muscle spasm     takes Robaxin prn  . Chronic back pain     stenosis  . Constipation   . Urinary frequency     pt states related to meds  . Hypothyroidism     takes Synthroid daily  . Insomnia     takes Ambien prn  . Anxiety     Past Surgical History  Procedure Laterality Date  . Thyroidectomy, partial    . Tubal ligation    . Colonoscopy    . Lumbar laminectomy/decompression microdiscectomy  03/19/2012    Procedure: LUMBAR LAMINECTOMY/DECOMPRESSION MICRODISCECTOMY 2 LEVELS;  Surgeon: Charlie Pitter, MD;  Location: [facility];  Service: Neurosurgery;  Laterality: Bilateral;  Bilateral Lumbar Two-Four Decompressive Laminectomy   Family History:  Family History  Problem Relation Age of Onset  . Anesthesia problems Neg Hx   . Hypotension Neg Hx   . Malignant hyperthermia Neg Hx   . Pseudochol deficiency Neg Hx   . Breast cancer Sister 67  . Anxiety disorder Mother   . Heart disease Father   . Angina Father   . Heart failure Father   . Alcohol abuse Brother    Social History:   Social History   Social History  . Marital Status: Divorced    Spouse Name: N/A  . Number of Children: N/A  . Years of  Education: N/A   Social History Main Topics  . Smoking status: Former Smoker    Start date: 09/29/1959    Quit date: 10/16/1984  . Smokeless tobacco: Never Used     Comment: quit in 1986  . Alcohol Use: 0.0 - 2.4 oz/week    0 Standard drinks or equivalent, 0-3 Glasses of wine, 0-1 Cans of beer, 0 Shots of liquor per week     Comment: occasionally wine  . Drug Use: No  . Sexual Activity: No   Other Topics Concern  . None   Social History Narrative   Additional Social History: Marital Status/Living:Divorced since 1972/lives with her dog  Employment History:Retired from [employer] in [place]; dropped out in the 11th grade to get married. Attended night school to obtain [place]  Family/Childhood History: Born in [place], has a younger brother, 1 older & 1 younger sister. Patient is the 2nd oldest. Describes childhood as "Mommy and daddy was with [place]. Daddy worked 1st shift, momma 2nd shift, we were poor but I didn't know it. We learned how to make clothes."  Children/Grand-children:Sterling Lanny Hurst 54/2 Grandchildren Janett Billow 22, Michael 19  Natural/Informal Support: God, younger sister, friend from church Musculoskeletal: Strength & Muscle Tone: within normal limits Gait & Station: normal Patient leans: N/A  Psychiatric Specialty Exam: HPI  Review of Systems  Psychiatric/Behavioral: Negative for depression, suicidal ideas, hallucinations, memory loss and substance abuse. The patient is nervous/anxious (Compulsions as discussed above). The patient does not have insomnia.     Blood pressure 138/84, pulse 75, temperature 98.6 F (37 C), temperature source Tympanic,  height 5' 3.5" (1.613 m), weight 147 lb 12.8 oz (67.042 kg), SpO2 97 %.Body mass index is 25.77 kg/(m^2).  General Appearance: Well Groomed  Eye Contact:  Good  Speech:  Normal Rate  Volume:  Normal  Mood:  Anxious  Affect:  Congruent and Pleasant, able to smile  Thought Process:  Circumstantial  Orientation:  Full (Time, Place, and Person)  Thought Content:  Negative  Suicidal Thoughts:  No  Homicidal Thoughts:  No  Memory:  Immediate;   Good Recent;   Good Remote;   Good  Judgement:  Good  Insight:  Good  Psychomotor Activity:  Negative  Concentration:  Good  Recall:  Good  Fund of Knowledge:Good  Language: Good  Akathisia:  Negative  Handed:   AIMS (if indicated):   Assets:  Communication Skills Desire for Improvement Leisure Time  ADL's:  Intact  Cognition: WNL  Sleep:  fair   Is the patient at risk to self?  No. Has the patient been a risk to self in the past 6 months?  No. Has the patient been a risk to self within the distant past?  No. Is the patient a risk to others?  No. Has the patient been a risk  to others in the past 6 months?  No. Has the patient been a risk to others within the distant past?  No.  Allergies:   Allergies  Allergen Reactions  . Atorvastatin Other (See Comments)   Current Medications: Current Outpatient Prescriptions  Medication Sig Dispense Refill  . levothyroxine (SYNTHROID, LEVOTHROID) 88 MCG tablet Take 88 mcg by mouth daily.    . meloxicam (MOBIC) 7.5 MG tablet Take 7.5 mg by mouth daily.    . simvastatin (ZOCOR) 20 MG tablet Take 20 mg by mouth every evening.    . zolpidem (AMBIEN) 5 MG tablet Take 5 mg by mouth at bedtime as needed. For sleep    . sertraline (ZOLOFT) 50 MG tablet Take one half a tablet in the  morning for seven days and then increase to one whole tablet in the morning. 30 tablet 1   No current facility-administered medications for this visit.    Previous Psychotropic Medications: Yes  Valium several years  ago. Ambien which she takes irregularly Substance Abuse History in the last 12 months:  No. Current use of alcohol is approximately 1 drink per week. She stated there was a period where she used heavily for about 7 years that occurred 20 years ago. She denies any use of illicit drugs. She did smoke cigarettes from [age] and then quit. She states then she restarted age 42 and smoked for 7 years but she states she finally stopped smoking in 1986. Consequences of Substance Abuse: NA  Medical Decision Making:  New Problem, with no additional work-up planned (3) and Review of New Medication or Change in Dosage (2)  Treatment Plan Summary: Medication management and Plan   Obsessive-compulsive disorder-patient's states he has habit of harming and feels compelled to do this. She also states she has constant twitching in her nose. It appears there was some issues like this as a child related to the facial tics but then it went away. We will start drooling. We'll start 25 mg in the morning for 7 days and then she'll go to [address] [facility] in the morning. Risk and benefits have been discussed and patient is able to consent.   In regards to risk assessment patient has anxiety and age as risk factors. She has protective factors of gender, no past suicide attempts, engage in treatment, engaged with volunteer work at her church, denies any depression and has no past suicide attempts as protective. At this time low risk of imminent harm to herself or others.   Patient will follow up in 1 month. Encouraged her to call with any questions or concerns prior to her next appointment.    [name] 12/14/20169:46 AM

## 2015-10-13 ENCOUNTER — Ambulatory Visit (INDEPENDENT_AMBULATORY_CARE_PROVIDER_SITE_OTHER): Payer: Medicare Other | Admitting: Licensed Clinical Social Worker

## 2015-10-13 DIAGNOSIS — F429 Obsessive-compulsive disorder, unspecified: Secondary | ICD-10-CM | POA: Diagnosis not present

## 2015-10-13 NOTE — Progress Notes (Signed)
   THERAPIST PROGRESS NOTE  Session Time: 58min   Participation Level: Active  Behavioral Response: Casual and NeatAlertAnxious  Type of Therapy: Individual Therapy  Treatment Goals addressed: Coping  Interventions: CBT, Motivational Interviewing, Solution Focused, Supportive, Family Systems and Reframing  Summary: Vanessa Vaughn is a 74 y.o. female who presents with continued symptoms of her diagnosis.  Discussion of her diagnosis.  Explored her misconceptions of OCD.  Reviewed current stressors and coping skills to each.  Role played coping skills of deep breathing and cognitive restructuring.  Suicidal/Homicidal: Nowithout intent/plan  Therapist Response: LCSW provided Patient with ongoing emotional support and encouragement.  Normalized her feelings.  Commended Patient on her progress and reinforced the importance of client staying focused on her own strengths and resources and resiliency. Processed various strategies for dealing with stressors.    Plan: Return again in 2 weeks.  Diagnosis: Axis I: Obsessive Compulsive Disorder    Axis II: No diagnosis    Lubertha South 10/13/2015

## 2015-10-28 ENCOUNTER — Ambulatory Visit (INDEPENDENT_AMBULATORY_CARE_PROVIDER_SITE_OTHER): Payer: Medicare Other | Admitting: Psychiatry

## 2015-10-28 ENCOUNTER — Encounter: Payer: Self-pay | Admitting: Psychiatry

## 2015-10-28 VITALS — BP 128/72 | HR 76 | Temp 98.4°F | Ht 63.0 in | Wt 146.2 lb

## 2015-10-28 DIAGNOSIS — F429 Obsessive-compulsive disorder, unspecified: Secondary | ICD-10-CM | POA: Diagnosis not present

## 2015-10-28 DIAGNOSIS — F411 Generalized anxiety disorder: Secondary | ICD-10-CM

## 2015-10-28 MED ORDER — SERTRALINE HCL 50 MG PO TABS: 50.0000 mg | ORAL_TABLET | Freq: Every day | ORAL | Status: DC

## 2015-10-28 NOTE — Progress Notes (Signed)
[facility] MD/PA/NP OP Progress Note  10/28/2015 1:45 PM Vanessa Vaughn  MRN:  [id number]  Subjective:  Patient returns for follow-up of her obsessive-compulsive disorder. Her main issue is been humming various songs and facial twitching/tics. At the last visit we had started some sertraline and titrated up to 50 mg daily. She reports she feels like there is been some benefit in terms of keeping her calm. She states that the holidays she was anxious and feels like she got through it better because of the sertraline. I did discuss with her that OCD sometimes responds to higher doses of the antidepressants however patient feels more comfortable discontinuing with the 50 mg. She states she has not had any significant side effects aside from vivid dreams. She states the dreams are not disturbing they're just very vivid and almost life like. Chief Complaint:  Chief Complaint    Follow-up; Medication Refill     Visit Diagnosis:     ICD-9-CM ICD-10-CM   1. OCD (obsessive compulsive disorder) 300.3 F42.9   2. GAD (generalized anxiety disorder) 300.02 F41.1     Past Medical History:  Past Medical History  Diagnosis Date  . PONV (postoperative nausea and vomiting)   . Hyperlipidemia     takes Simvastatin daily  . Hx of migraines     5+yrs ago;pt states caused by Lipitor  . Muscle spasm     takes Robaxin prn  . Chronic back pain     stenosis  . Constipation   . Urinary frequency     pt states related to meds  . Hypothyroidism     takes Synthroid daily  . Insomnia     takes Ambien prn  . Anxiety     Past Surgical History  Procedure Laterality Date  . Thyroidectomy, partial    . Tubal ligation    . Colonoscopy    . Lumbar laminectomy/decompression microdiscectomy  03/19/2012    Procedure: LUMBAR LAMINECTOMY/DECOMPRESSION MICRODISCECTOMY 2 LEVELS;  Surgeon: Charlie Pitter, MD;  Location: [facility];  Service: Neurosurgery;  Laterality: Bilateral;  Bilateral Lumbar Two-Four Decompressive  Laminectomy   Family History:  Family History  Problem Relation Age of Onset  . Anesthesia problems Neg Hx   . Hypotension Neg Hx   . Malignant hyperthermia Neg Hx   . Pseudochol deficiency Neg Hx   . Breast cancer Sister 63  . Anxiety disorder Mother   . Heart disease Father   . Angina Father   . Heart failure Father   . Alcohol abuse Brother    Social History:  Social History   Social History  . Marital Status: Divorced    Spouse Name: N/A  . Number of Children: N/A  . Years of Education: N/A   Social History Main Topics  . Smoking status: Former Smoker    Start date: 09/29/1959    Quit date: 10/16/1984  . Smokeless tobacco: Never Used     Comment: quit in 1986  . Alcohol Use: 0.0 - 2.4 oz/week    0 Standard drinks or equivalent, 0-3 Glasses of wine, 0-1 Cans of beer, 0 Shots of liquor per week     Comment: occasionally wine  . Drug Use: No  . Sexual Activity: No   Other Topics Concern  . None   Social History Narrative   Additional History:   Assessment:   Musculoskeletal: Strength & Muscle Tone: within normal limits Gait & Station: normal Patient leans: N/A  Psychiatric Specialty Exam: HPI  ROS  Blood pressure 128/72, pulse 76, temperature 98.4 F (36.9 C), temperature source Tympanic, height 5\' 3"  (1.6 m), weight 146 lb 3.2 oz (66.316 kg), SpO2 96 %.Body mass index is 25.9 kg/(m^2).  General Appearance: Well Groomed  Eye Contact:  Good  Speech:  Normal Rate  Volume:  Normal  Mood:  Good  Affect:  Constricted  Thought Process:  Linear and Logical  Orientation:  Full (Time, Place, and Person)  Thought Content:  Negative  Suicidal Thoughts:  No  Homicidal Thoughts:  No  Memory:  Immediate;   Good Recent;   Good Remote;   Good  Judgement:  Good  Insight:  Good  Psychomotor Activity:  Negative  Concentration:  Good  Recall:  Good  Fund of Knowledge: Good  Language: Good  Akathisia:  Negative  Handed:    AIMS (if indicated):    Assets:   Communication Skills Desire for Improvement  ADL's:  Intact  Cognition: WNL  Sleep:  good   Is the patient at risk to self?  No. Has the patient been a risk to self in the past 6 months?  No. Has the patient been a risk to self within the distant past?  No. Is the patient a risk to others?  No. Has the patient been a risk to others in the past 6 months?  No. Has the patient been a risk to others within the distant past?  No.  Current Medications: Current Outpatient Prescriptions  Medication Sig Dispense Refill  . levothyroxine (SYNTHROID, LEVOTHROID) 88 MCG tablet Take 88 mcg by mouth daily.    . meloxicam (MOBIC) 7.5 MG tablet Take 7.5 mg by mouth daily.    . sertraline (ZOLOFT) 50 MG tablet Take 1 tablet (50 mg total) by mouth daily. 90 tablet 1  . simvastatin (ZOCOR) 20 MG tablet Take 20 mg by mouth every evening.    . zolpidem (AMBIEN) 5 MG tablet Take 5 mg by mouth at bedtime as needed. For sleep     No current facility-administered medications for this visit.    Medical Decision Making:  Established Problem, Stable/Improving (1) and Review of Medication Regimen & Side Effects (2)  Treatment Plan Summary:Medication management and Plan Plan  Obsessive-compulsive disorder-patient's states he has habit of humming and feels compelled to do this. In addition she has some facial twitching tics. She indicates perhaps the tics have decreased slightly but states the humming has continued unchanged. She prefers not to increase her dose of sertraline. We'll continue her sertraline 50 mg daily.  She will follow-up in 2 months. She's aware that will be with another provider in this clinic. She's been encouraged call any questions or concerns prior to her next appointment. [name] 10/28/2015, 1:45 PM

## 2015-11-10 ENCOUNTER — Ambulatory Visit (INDEPENDENT_AMBULATORY_CARE_PROVIDER_SITE_OTHER): Payer: Medicare Other | Admitting: Licensed Clinical Social Worker

## 2015-11-10 DIAGNOSIS — F429 Obsessive-compulsive disorder, unspecified: Secondary | ICD-10-CM

## 2015-11-17 NOTE — Progress Notes (Signed)
   THERAPIST PROGRESS NOTE  Session Time: 15min  Participation Level: Active  Behavioral Response: CasualAlertAnxious  Type of Therapy: Individual Therapy  Treatment Goals addressed: Coping and Diagnosis: OCD  Interventions: CBT, Motivational Interviewing, Solution Focused, Supportive and Reframing  Summary: Vanessa Vaughn is a 75 y.o. female who presents with continued symptoms of her diagnosis. Reviewed her thoughts of her diagnosis and allowed her time to ask questions.  Patient continues to believe that she does not need therapy. Patient states "I had OCD as a teenager but not now.  I am just like everyone else." Explored her misconceptions of OCD. Reviewed current stressors and coping skills to each.    Suicidal/Homicidal: Nowithout intent/plan  Therapist Response: LCSW provided Patient with ongoing emotional support and encouragement.  Normalized her feelings.  Commended Patient on her progress and reinforced the importance of client staying focused on her own strengths and resources and resiliency. Processed various strategies for dealing with stressors.    Plan: Return again in 2 weeks.  Diagnosis: Axis I: Obsessive Compulsive Disorder    Axis II: No diagnosis    Lubertha South 11/11/2015

## 2015-12-23 ENCOUNTER — Ambulatory Visit (INDEPENDENT_AMBULATORY_CARE_PROVIDER_SITE_OTHER): Payer: Medicare Other | Admitting: Psychiatry

## 2015-12-23 ENCOUNTER — Encounter: Payer: Self-pay | Admitting: Psychiatry

## 2015-12-23 DIAGNOSIS — F429 Obsessive-compulsive disorder, unspecified: Secondary | ICD-10-CM

## 2015-12-23 MED ORDER — SERTRALINE HCL 25 MG PO TABS: 25.0000 mg | ORAL_TABLET | Freq: Every day | ORAL | Status: DC

## 2015-12-23 MED ORDER — RISPERIDONE 0.25 MG PO TABS: 0.2500 mg | ORAL_TABLET | Freq: Every day | ORAL | Status: DC

## 2015-12-23 NOTE — Progress Notes (Signed)
Patient ID: Vanessa Vaughn, female   DOB: 1941-07-28, 75 y.o.   MRN: [id number] [facility] MD/PA/NP OP Progress Note  12/23/2015 1:08 PM Vanessa Vaughn  MRN:  [id number]  Subjective:  Patient returns for follow-up of her obsessive-compulsive disorder. Patient was seen previously by dr.Williams. This is the first visit for this patient with this clinician. She states she continues to have the facial twitches, but decreased the zoloft to 25mg  for the past 3 weeks. States she had very vivid dreams that interfered with her sleep and she cut the Zoloft to 25mg . States she is okay with this dosage. However she reports that her facial twitches to bother her specially when she is in public. She wonders if there was something else she could take to help with the twitches. States she had severe OCD symptoms AS a child, but they went away as a teenager. States they came back about 2 years ago.  She is active in her church and is close to her son and her grandchildren.  Chief Complaint: facial twitches Chief Complaint    Follow-up; Medication Refill     Visit Diagnosis:   No diagnosis found.  Past Medical History:  Past Medical History  Diagnosis Date  . PONV (postoperative nausea and vomiting)   . Hyperlipidemia     takes Simvastatin daily  . Hx of migraines     5+yrs ago;pt states caused by Lipitor  . Muscle spasm     takes Robaxin prn  . Chronic back pain     stenosis  . Constipation   . Urinary frequency     pt states related to meds  . Hypothyroidism     takes Synthroid daily  . Insomnia     takes Ambien prn  . Anxiety     Past Surgical History  Procedure Laterality Date  . Thyroidectomy, partial    . Tubal ligation    . Colonoscopy    . Lumbar laminectomy/decompression microdiscectomy  03/19/2012    Procedure: LUMBAR LAMINECTOMY/DECOMPRESSION MICRODISCECTOMY 2 LEVELS;  Surgeon: Charlie Pitter, MD;  Location: [facility];  Service: Neurosurgery;  Laterality: Bilateral;  Bilateral Lumbar  Two-Four Decompressive Laminectomy   Family History:  Family History  Problem Relation Age of Onset  . Anesthesia problems Neg Hx   . Hypotension Neg Hx   . Malignant hyperthermia Neg Hx   . Pseudochol deficiency Neg Hx   . Breast cancer Sister 18  . Anxiety disorder Mother   . Heart disease Father   . Angina Father   . Heart failure Father   . Alcohol abuse Brother    Social History:  Social History   Social History  . Marital Status: Divorced    Spouse Name: N/A  . Number of Children: N/A  . Years of Education: N/A   Social History Main Topics  . Smoking status: Former Smoker    Start date: 09/29/1959    Quit date: 10/16/1984  . Smokeless tobacco: Never Used     Comment: quit in 1986  . Alcohol Use: 0.0 - 2.4 oz/week    0 Standard drinks or equivalent, 0-3 Glasses of wine, 0-1 Cans of beer, 0 Shots of liquor per week     Comment: occasionally wine  . Drug Use: No  . Sexual Activity: No   Other Topics Concern  . None   Social History Narrative   Additional History:   Assessment:   Musculoskeletal: Strength & Muscle Tone: within normal limits Gait & Station:  normal Patient leans: N/A  Psychiatric Specialty Exam: HPI  ROS  Blood pressure 122/76, pulse 75, temperature 98.9 F (37.2 C), temperature source Tympanic, height 5\' 3"  (1.6 m), weight 147 lb 12.8 oz (67.042 kg), SpO2 97 %.Body mass index is 26.19 kg/(m^2).  General Appearance: Well Groomed  Eye Contact:  Good  Speech:  Normal Rate  Volume:  Normal  Mood:  Good  Affect:  wnl  Thought Process:  Linear and Logical  Orientation:  Full (Time, Place, and Person)  Thought Content:  Negative  Suicidal Thoughts:  No  Homicidal Thoughts:  No  Memory:  Immediate;   Good Recent;   Good Remote;   Good  Judgement:  Good  Insight:  Good  Psychomotor Activity:  Negative  Concentration:  Good  Recall:  Good  Fund of Knowledge: Good  Language: Good  Akathisia:  Negative  Handed:    AIMS (if  indicated):    Assets:  Communication Skills Desire for Improvement  ADL's:  Intact  Cognition: WNL  Sleep:  good   Is the patient at risk to self?  No. Has the patient been a risk to self in the past 6 months?  No. Has the patient been a risk to self within the distant past?  No. Is the patient a risk to others?  No. Has the patient been a risk to others in the past 6 months?  No. Has the patient been a risk to others within the distant past?  No.  Current Medications: Current Outpatient Prescriptions  Medication Sig Dispense Refill  . levothyroxine (SYNTHROID, LEVOTHROID) 88 MCG tablet Take 88 mcg by mouth daily.    . meloxicam (MOBIC) 7.5 MG tablet Take 7.5 mg by mouth daily.    . sertraline (ZOLOFT) 50 MG tablet Take 1 tablet (50 mg total) by mouth daily. 90 tablet 1  . simvastatin (ZOCOR) 20 MG tablet Take 20 mg by mouth every evening.    . zolpidem (AMBIEN) 5 MG tablet Take 5 mg by mouth at bedtime as needed. For sleep     No current facility-administered medications for this visit.    Medical Decision Making:  Established Problem, Stable/Improving (1) and Review of Medication Regimen & Side Effects (2)  Treatment Plan Summary:Medication management and Plan Plan  Obsessive-compulsive disorder- Decrease sertraline to 25mg  daily. Start Risperdal at 0.25mg  po qhs to help with the tics. Side effects and long term effects with movement disorders discussed with patient.  Insomnia- take ambien 5mg  po qhs prn as needed.  She will follow-up in 1 months. She's been encouraged call any questions or concerns prior to her next appointment.  Vanessa Vaughn 12/23/2015, 1:08 PM

## 2016-01-20 ENCOUNTER — Ambulatory Visit: Payer: Medicare Other | Admitting: Psychiatry

## 2016-02-08 ENCOUNTER — Encounter: Payer: Self-pay | Admitting: Psychiatry

## 2016-02-08 ENCOUNTER — Ambulatory Visit (INDEPENDENT_AMBULATORY_CARE_PROVIDER_SITE_OTHER): Payer: Medicare Other | Admitting: Psychiatry

## 2016-02-08 ENCOUNTER — Telehealth: Payer: Self-pay

## 2016-02-08 VITALS — BP 122/78 | HR 90 | Temp 97.3°F | Ht 63.0 in | Wt 146.4 lb

## 2016-02-08 DIAGNOSIS — F411 Generalized anxiety disorder: Secondary | ICD-10-CM

## 2016-02-08 DIAGNOSIS — F429 Obsessive-compulsive disorder, unspecified: Secondary | ICD-10-CM | POA: Diagnosis not present

## 2016-02-08 MED ORDER — SERTRALINE HCL 25 MG PO TABS: 25.0000 mg | ORAL_TABLET | Freq: Every day | ORAL | Status: DC

## 2016-02-08 MED ORDER — ZOLPIDEM TARTRATE 5 MG PO TABS: 5.0000 mg | ORAL_TABLET | Freq: Every evening | ORAL | Status: DC | PRN

## 2016-02-08 NOTE — Telephone Encounter (Signed)
faxed and confirmed rx to [facility] :  zoloft 25mg   id # [id number] order # [id number]  and zolpidem 5mg  id # [id number] order # [id number]

## 2016-02-08 NOTE — Progress Notes (Signed)
Patient ID: Vanessa Vaughn, female   DOB: [birth year], 75 y.o.   MRN: [id number]  [facility] MD/PA/NP OP Progress Note  02/08/2016 2:52 PM Vanessa Vaughn  MRN:  [id number]  Subjective:  Patient returns for follow-up of her obsessive-compulsive disorder. Patient today reports that she did not take the Risperdal as prescribed last visit. States that she read up about the side effects and was concerned and did not take it. States she is doing okay. She also had a week's time then she did not take the Zoloft and states that the twitches came back very bad. She then restarted on the Zoloft. For the most part she is doing well on the Zoloft except that she has been vivid dreams at night which can be bothersome. States that compared to not having the twitches she is okay with taking the Zoloft at 25 mg. Reports she is doing well with her family. She is enjoying her activities at church. Fair sleep and appetite.   Chief Complaint: Doing well Chief Complaint    Follow-up; Medication Refill     Visit Diagnosis:     ICD-9-CM ICD-10-CM   1. OCD (obsessive compulsive disorder) 300.3 F42.9   2. GAD (generalized anxiety disorder) 300.02 F41.1     Past Medical History:  Past Medical History  Diagnosis Date  . PONV (postoperative nausea and vomiting)   . Hyperlipidemia     takes Simvastatin daily  . Hx of migraines     5+yrs ago;pt states caused by Lipitor  . Muscle spasm     takes Robaxin prn  . Chronic back pain     stenosis  . Constipation   . Urinary frequency     pt states related to meds  . Hypothyroidism     takes Synthroid daily  . Insomnia     takes Ambien prn  . Anxiety     Past Surgical History  Procedure Laterality Date  . Thyroidectomy, partial    . Tubal ligation    . Colonoscopy    . Lumbar laminectomy/decompression microdiscectomy  03/19/2012    Procedure: LUMBAR LAMINECTOMY/DECOMPRESSION MICRODISCECTOMY 2 LEVELS;  Surgeon: Charlie Pitter, MD;  Location: [place] [facility];  Service:  Neurosurgery;  Laterality: Bilateral;  Bilateral Lumbar Two-Four Decompressive Laminectomy   Family History:  Family History  Problem Relation Age of Onset  . Anesthesia problems Neg Hx   . Hypotension Neg Hx   . Malignant hyperthermia Neg Hx   . Pseudochol deficiency Neg Hx   . Breast cancer Sister 55  . Anxiety disorder Mother   . Heart disease Father   . Angina Father   . Heart failure Father   . Alcohol abuse Brother    Social History:  Social History   Social History  . Marital Status: Divorced    Spouse Name: N/A  . Number of Children: N/A  . Years of Education: N/A   Social History Main Topics  . Smoking status: Former Smoker    Start date: 09/29/1959    Quit date: 10/16/1984  . Smokeless tobacco: Never Used     Comment: quit in 1986  . Alcohol Use: 0.0 - 1.8 oz/week    0-3 Glasses of wine, 0 Shots of liquor, 0 Standard drinks or equivalent, 0 Cans of beer per week     Comment: occasionally wine  . Drug Use: No  . Sexual Activity: No   Other Topics Concern  . None   Social History Narrative   Additional History:  Assessment:   Musculoskeletal: Strength & Muscle Tone: within normal limits Gait & Station: normal Patient leans: N/A  Psychiatric Specialty Exam: HPI  ROS  Blood pressure 122/78, pulse 90, temperature 97.3 F (36.3 C), temperature source Tympanic, height 5\' 3"  (1.6 m), weight 146 lb 6.4 oz (66.407 kg), SpO2 95 %.Body mass index is 25.94 kg/(m^2).  General Appearance: Well Groomed  Eye Contact:  Good  Speech:  Normal Rate  Volume:  Normal  Mood:  Good  Affect:  wnl  Thought Process:  Linear and Logical  Orientation:  Full (Time, Place, and Person)  Thought Content:  Negative  Suicidal Thoughts:  No  Homicidal Thoughts:  No  Memory:  Immediate;   Good Recent;   Good Remote;   Good  Judgement:  Good  Insight:  Good  Psychomotor Activity:  Negative  Concentration:  Good  Recall:  Good  Fund of Knowledge: Good  Language: Good   Akathisia:  Negative  Handed:    AIMS (if indicated):    Assets:  Communication Skills Desire for Improvement  ADL's:  Intact  Cognition: WNL  Sleep:  good   Is the patient at risk to self?  No. Has the patient been a risk to self in the past 6 months?  No. Has the patient been a risk to self within the distant past?  No. Is the patient a risk to others?  No. Has the patient been a risk to others in the past 6 months?  No. Has the patient been a risk to others within the distant past?  No.  Current Medications: Current Outpatient Prescriptions  Medication Sig Dispense Refill  . levothyroxine (SYNTHROID, LEVOTHROID) 88 MCG tablet Take 88 mcg by mouth daily.    . meloxicam (MOBIC) 7.5 MG tablet Take 7.5 mg by mouth daily.    . sertraline (ZOLOFT) 25 MG tablet Take 1 tablet (25 mg total) by mouth daily. 30 tablet 2  . simvastatin (ZOCOR) 20 MG tablet Take 20 mg by mouth every evening.    . zolpidem (AMBIEN) 5 MG tablet Take 5 mg by mouth at bedtime as needed. For sleep     No current facility-administered medications for this visit.    Medical Decision Making:  Established Problem, Stable/Improving (1) and Review of Medication Regimen & Side Effects (2)  Treatment Plan Summary:Medication management and Plan Plan  Obsessive-compulsive disorder- Continue sertraline to 25mg  daily.   Insomnia- take ambien 5mg  po qhs prn as needed.  She will follow-up in 2 months. She's been encouraged call any questions or concerns prior to her next appointment.  Trai Ells 02/08/2016, 2:52 PM

## 2016-02-18 DIAGNOSIS — M25512 Pain in left shoulder: Secondary | ICD-10-CM | POA: Diagnosis not present

## 2016-02-18 DIAGNOSIS — E039 Hypothyroidism, unspecified: Secondary | ICD-10-CM | POA: Diagnosis not present

## 2016-02-18 DIAGNOSIS — E7800 Pure hypercholesterolemia, unspecified: Secondary | ICD-10-CM | POA: Diagnosis not present

## 2016-02-18 DIAGNOSIS — M25511 Pain in right shoulder: Secondary | ICD-10-CM | POA: Diagnosis not present

## 2016-02-25 DIAGNOSIS — E7800 Pure hypercholesterolemia, unspecified: Secondary | ICD-10-CM | POA: Diagnosis not present

## 2016-02-25 DIAGNOSIS — J3489 Other specified disorders of nose and nasal sinuses: Secondary | ICD-10-CM | POA: Diagnosis not present

## 2016-02-25 DIAGNOSIS — R079 Chest pain, unspecified: Secondary | ICD-10-CM | POA: Diagnosis not present

## 2016-02-25 DIAGNOSIS — M549 Dorsalgia, unspecified: Secondary | ICD-10-CM | POA: Diagnosis not present

## 2016-02-25 DIAGNOSIS — E039 Hypothyroidism, unspecified: Secondary | ICD-10-CM | POA: Diagnosis not present

## 2016-02-25 DIAGNOSIS — Z1239 Encounter for other screening for malignant neoplasm of breast: Secondary | ICD-10-CM | POA: Diagnosis not present

## 2016-02-28 ENCOUNTER — Other Ambulatory Visit: Payer: Self-pay | Admitting: Infectious Diseases

## 2016-02-28 ENCOUNTER — Other Ambulatory Visit: Payer: Self-pay | Admitting: Cardiovascular Disease

## 2016-02-28 DIAGNOSIS — Z1231 Encounter for screening mammogram for malignant neoplasm of breast: Secondary | ICD-10-CM

## 2016-04-04 ENCOUNTER — Other Ambulatory Visit: Payer: Self-pay | Admitting: Infectious Diseases

## 2016-04-04 ENCOUNTER — Ambulatory Visit
Admission: RE | Admit: 2016-04-04 | Discharge: 2016-04-04 | Disposition: A | Discharge status: Home / Self Care | Payer: Medicare Other | Source: Ambulatory Visit | Attending: Infectious Diseases | Admitting: Infectious Diseases

## 2016-04-04 DIAGNOSIS — Z1231 Encounter for screening mammogram for malignant neoplasm of breast: Secondary | ICD-10-CM | POA: Insufficient documentation

## 2016-04-05 ENCOUNTER — Ambulatory Visit: Payer: Medicare Other | Admitting: Psychiatry

## 2016-04-13 ENCOUNTER — Encounter: Payer: Self-pay | Admitting: Psychiatry

## 2016-04-13 ENCOUNTER — Ambulatory Visit (INDEPENDENT_AMBULATORY_CARE_PROVIDER_SITE_OTHER): Payer: Medicare Other | Admitting: Psychiatry

## 2016-04-13 VITALS — BP 110/68 | HR 78 | Temp 98.9°F | Ht 63.0 in | Wt 145.0 lb

## 2016-04-13 DIAGNOSIS — F429 Obsessive-compulsive disorder, unspecified: Secondary | ICD-10-CM | POA: Diagnosis not present

## 2016-04-13 DIAGNOSIS — F411 Generalized anxiety disorder: Secondary | ICD-10-CM | POA: Diagnosis not present

## 2016-04-13 MED ORDER — SERTRALINE HCL 25 MG PO TABS: 25.0000 mg | ORAL_TABLET | Freq: Every day | ORAL | Status: DC

## 2016-04-13 NOTE — Progress Notes (Signed)
Patient ID: Vanessa Vaughn, female   DOB: 06-15-1941, 75 y.o.   MRN: [id number]  Vanessa Vaughn  04/13/2016 2:03 PM Vanessa Vaughn  MRN:  [id number]  Subjective:  Patient returns for follow-up of her obsessive-compulsive disorder. Reports doing well. States she is enjoying the visit from her sister. States she is compliant with the Zoloft and denies any side effects. Sleeping well and eating well. She is enjoying her church activities. She has some twitches but reports they are manageable.  Chief Complaint: Doing well  Visit Diagnosis:     ICD-9-CM ICD-10-CM   1. OCD (obsessive compulsive disorder) 300.3 F42.9   2. GAD (generalized anxiety disorder) 300.02 F41.1     Past Medical History:  Past Medical History  Diagnosis Date  . PONV (postoperative nausea and vomiting)   . Hyperlipidemia     takes Simvastatin daily  . Hx of migraines     5+yrs ago;pt states caused by Lipitor  . Muscle spasm     takes Robaxin prn  . Chronic back pain     stenosis  . Constipation   . Urinary frequency     pt states related to meds  . Hypothyroidism     takes Synthroid daily  . Insomnia     takes Ambien prn  . Anxiety     Past Surgical History  Procedure Laterality Date  . Thyroidectomy, partial    . Tubal ligation    . Colonoscopy    . Lumbar laminectomy/decompression microdiscectomy  03/19/2012    Procedure: LUMBAR LAMINECTOMY/DECOMPRESSION MICRODISCECTOMY 2 LEVELS;  Surgeon: Charlie Pitter, MD;  Location: [place] [facility];  Service: Neurosurgery;  Laterality: Bilateral;  Bilateral Lumbar Two-Four Decompressive Laminectomy   Family History:  Family History  Problem Relation Age of Onset  . Anesthesia problems Neg Hx   . Hypotension Neg Hx   . Malignant hyperthermia Neg Hx   . Pseudochol deficiency Neg Hx   . Breast cancer Sister 60  . Anxiety disorder Mother   . Heart disease Father   . Angina Father   . Heart failure Father   . Alcohol abuse Brother    Social  History:  Social History   Social History  . Marital Status: Divorced    Spouse Name: N/A  . Number of Children: N/A  . Years of Education: N/A   Social History Main Topics  . Smoking status: Former Smoker    Start date: 09/29/1959    Quit date: 10/16/1984  . Smokeless tobacco: Never Used     Comment: quit in 1986  . Alcohol Use: 0.0 - 1.8 oz/week    0-3 Glasses of wine, 0 Shots of liquor, 0 Standard drinks or equivalent, 0 Cans of beer per week     Comment: occasionally wine  . Drug Use: No  . Sexual Activity: No   Other Topics Concern  . Not on file   Social History Narrative   Additional History:   Assessment:   Musculoskeletal: Strength & Muscle Tone: within normal limits Gait & Station: normal Patient leans: N/A  Psychiatric Specialty Exam: HPI  ROS  There were no vitals taken for this visit.There is no weight on file to calculate BMI.  General Appearance: Well Groomed  Eye Contact:  Good  Speech:  Normal Rate  Volume:  Normal  Mood:  Good  Affect:  wnl  Thought Process:  Linear and Logical  Orientation:  Full (Time, Place, and Person)  Thought Content:  Negative  Suicidal Thoughts:  No  Homicidal Thoughts:  No  Memory:  Immediate;   Good Recent;   Good Remote;   Good  Judgement:  Good  Insight:  Good  Psychomotor Activity:  Negative  Concentration:  Good  Recall:  Good  Fund of Knowledge: Good  Language: Good  Akathisia:  Negative  Handed:    AIMS (if indicated):    Assets:  Communication Skills Desire for Improvement  ADL's:  Intact  Cognition: WNL  Sleep:  good   Is the patient at risk to self?  No. Has the patient been a risk to self in the past 6 months?  No. Has the patient been a risk to self within the distant past?  No. Is the patient a risk to others?  No. Has the patient been a risk to others in the past 6 months?  No. Has the patient been a risk to others within the distant past?  No.  Current Medications: Current  Outpatient Prescriptions  Medication Sig Dispense Refill  . levothyroxine (SYNTHROID, LEVOTHROID) 88 MCG tablet Take 88 mcg by mouth daily.    . meloxicam (MOBIC) 7.5 MG tablet Take 7.5 mg by mouth daily.    . sertraline (ZOLOFT) 25 MG tablet Take 1 tablet (25 mg total) by mouth daily. 90 tablet 1  . simvastatin (ZOCOR) 20 MG tablet Take 20 mg by mouth every evening.    . zolpidem (AMBIEN) 5 MG tablet Take 1 tablet (5 mg total) by mouth at bedtime as needed. For sleep 45 tablet 0   No current facility-administered medications for this visit.    Medical Decision Making:  Established Problem, Stable/Improving (1) and Review of Medication Regimen & Side Effects (2)  Treatment Plan Summary:Medication management and Plan Plan  Obsessive-compulsive disorder- Continue sertraline to 25mg  daily.  Insomnia- discontinue the ambien.  She will follow-up in 3 months. She's been encouraged call any questions or concerns prior to her next appointment.  Alcus Bradly 04/13/2016, 2:03 PM

## 2016-05-31 DIAGNOSIS — M542 Cervicalgia: Secondary | ICD-10-CM | POA: Diagnosis not present

## 2016-05-31 DIAGNOSIS — M62838 Other muscle spasm: Secondary | ICD-10-CM | POA: Diagnosis not present

## 2016-07-12 ENCOUNTER — Encounter: Payer: Self-pay | Admitting: Psychiatry

## 2016-07-12 ENCOUNTER — Ambulatory Visit (INDEPENDENT_AMBULATORY_CARE_PROVIDER_SITE_OTHER): Payer: Medicare Other | Admitting: Psychiatry

## 2016-07-12 VITALS — BP 151/82 | HR 76 | Temp 98.3°F | Wt 145.2 lb

## 2016-07-12 DIAGNOSIS — F411 Generalized anxiety disorder: Secondary | ICD-10-CM

## 2016-07-12 DIAGNOSIS — F429 Obsessive-compulsive disorder, unspecified: Secondary | ICD-10-CM

## 2016-07-12 MED ORDER — SERTRALINE HCL 50 MG PO TABS: 50.0000 mg | ORAL_TABLET | Freq: Every day | ORAL | 1 refills | Status: DC

## 2016-07-12 NOTE — Progress Notes (Signed)
Patient ID: Vanessa Vaughn, female   DOB: 1941/04/29, 75 y.o.   MRN: [id number]  [facility] MD/PA/NP OP Progress Note  07/12/2016 10:47 AM ABBEYGAIL TEICHMANN  MRN:  [id number]  Subjective:  Patient returns for follow-up of her obsessive-compulsive disorder. Reports doing well. Lately she has been obsessing about her being controlling all her life. However she is enjoying her family, son has a new house. Denies any suicidal thoughts.  Chief Complaint: Doing well Chief Complaint    Follow-up; Medication Refill     Visit Diagnosis:     ICD-9-CM ICD-10-CM   1. OCD (obsessive compulsive disorder) 300.3 F42.9   2. GAD (generalized anxiety disorder) 300.02 F41.1     Past Medical History:  Past Medical History:  Diagnosis Date  . Anxiety   . Chronic back pain    stenosis  . Constipation   . Hx of migraines    5+yrs ago;pt states caused by Lipitor  . Hyperlipidemia    takes Simvastatin daily  . Hypothyroidism    takes Synthroid daily  . Insomnia    takes Ambien prn  . Muscle spasm    takes Robaxin prn  . PONV (postoperative nausea and vomiting)   . Urinary frequency    pt states related to meds    Past Surgical History:  Procedure Laterality Date  . COLONOSCOPY    . LUMBAR LAMINECTOMY/DECOMPRESSION MICRODISCECTOMY  03/19/2012   Procedure: LUMBAR LAMINECTOMY/DECOMPRESSION MICRODISCECTOMY 2 LEVELS;  Surgeon: Charlie Pitter, MD;  Location: [place] [facility];  Service: Neurosurgery;  Laterality: Bilateral;  Bilateral Lumbar Two-Four Decompressive Laminectomy  . THYROIDECTOMY, PARTIAL    . TUBAL LIGATION     Family History:  Family History  Problem Relation Age of Onset  . Anesthesia problems Neg Hx   . Hypotension Neg Hx   . Malignant hyperthermia Neg Hx   . Pseudochol deficiency Neg Hx   . Breast cancer Sister 56  . Anxiety disorder Mother   . Heart disease Father   . Angina Father   . Heart failure Father   . Alcohol abuse Brother    Social History:  Social History   Social  History  . Marital status: Divorced    Spouse name: N/A  . Number of children: N/A  . Years of education: N/A   Social History Main Topics  . Smoking status: Former Smoker    Start date: 09/29/1959    Quit date: 10/16/1984  . Smokeless tobacco: Never Used     Comment: quit in 1986  . Alcohol use 0.0 - 1.8 oz/week     Comment: occasionally wine  . Drug use: No  . Sexual activity: No   Other Topics Concern  . None   Social History Narrative  . None   Additional History:   Assessment:   Musculoskeletal: Strength & Muscle Tone: within normal limits Gait & Station: normal Patient leans: N/A  Psychiatric Specialty Exam: HPI  ROS  Blood pressure (!) 151/82, pulse 76, temperature 98.3 F (36.8 C), temperature source Oral, weight 145 lb 3.2 oz (65.9 kg).Body mass index is 25.72 kg/m.  General Appearance: Well Groomed  Eye Contact:  Good  Speech:  Normal Rate  Volume:  Normal  Mood:  Good  Affect:  wnl  Thought Process:  Linear and Logical  Orientation:  Full (Time, Place, and Person)  Thought Content:  Negative  Suicidal Thoughts:  No  Homicidal Thoughts:  No  Memory:  Immediate;   Good Recent;   Good Remote;  Good  Judgement:  Good  Insight:  Good  Psychomotor Activity:  Negative  Concentration:  Good  Recall:  Good  Fund of Knowledge: Good  Language: Good  Akathisia:  Negative  Handed:    AIMS (if indicated):    Assets:  Communication Skills Desire for Improvement  ADL's:  Intact  Cognition: WNL  Sleep:  good   Is the patient at risk to self?  No. Has the patient been a risk to self in the past 6 months?  No. Has the patient been a risk to self within the distant past?  No. Is the patient a risk to others?  No. Has the patient been a risk to others in the past 6 months?  No. Has the patient been a risk to others within the distant past?  No.  Current Medications: Current Outpatient Prescriptions  Medication Sig Dispense Refill  . levothyroxine  (SYNTHROID, LEVOTHROID) 88 MCG tablet Take 88 mcg by mouth daily.    . meloxicam (MOBIC) 7.5 MG tablet Take 7.5 mg by mouth daily.    . sertraline (ZOLOFT) 50 MG tablet Take 1 tablet (50 mg total) by mouth daily. 30 tablet 1  . simvastatin (ZOCOR) 20 MG tablet Take 20 mg by mouth every evening.     No current facility-administered medications for this visit.     Medical Decision Making:  Established Problem, Stable/Improving (1) and Review of Medication Regimen & Side Effects (2)  Treatment Plan Summary:Medication management and Plan Plan  Obsessive-compulsive disorder- Increase sertraline to 50mg  po qd to address obsessive symptoms.  Insomnia- resolved  She will follow-up in 1 months. She's been encouraged call any questions or concerns prior to her next appointment.  Levar Fayson 07/12/2016, 10:47 AM

## 2016-07-13 ENCOUNTER — Ambulatory Visit: Payer: Medicare Other | Admitting: Psychiatry

## 2016-07-14 DIAGNOSIS — R030 Elevated blood-pressure reading, without diagnosis of hypertension: Secondary | ICD-10-CM | POA: Diagnosis not present

## 2016-07-14 DIAGNOSIS — E039 Hypothyroidism, unspecified: Secondary | ICD-10-CM | POA: Diagnosis not present

## 2016-07-14 DIAGNOSIS — M542 Cervicalgia: Secondary | ICD-10-CM | POA: Diagnosis not present

## 2016-07-26 ENCOUNTER — Ambulatory Visit (INDEPENDENT_AMBULATORY_CARE_PROVIDER_SITE_OTHER): Payer: Medicare Other | Admitting: Licensed Clinical Social Worker

## 2016-07-26 DIAGNOSIS — F411 Generalized anxiety disorder: Secondary | ICD-10-CM

## 2016-07-26 DIAGNOSIS — F429 Obsessive-compulsive disorder, unspecified: Secondary | ICD-10-CM | POA: Diagnosis not present
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
## 2016-08-02 DIAGNOSIS — M50321 Other cervical disc degeneration at C4-C5 level: Secondary | ICD-10-CM | POA: Diagnosis not present

## 2016-08-02 DIAGNOSIS — M542 Cervicalgia: Secondary | ICD-10-CM | POA: Diagnosis not present

## 2016-08-04 DIAGNOSIS — M4722 Other spondylosis with radiculopathy, cervical region: Secondary | ICD-10-CM | POA: Diagnosis not present

## 2016-08-04 DIAGNOSIS — M5030 Other cervical disc degeneration, unspecified cervical region: Secondary | ICD-10-CM | POA: Diagnosis not present

## 2016-08-04 DIAGNOSIS — M5412 Radiculopathy, cervical region: Secondary | ICD-10-CM | POA: Diagnosis not present

## 2016-08-04 DIAGNOSIS — M4802 Spinal stenosis, cervical region: Secondary | ICD-10-CM | POA: Diagnosis not present

## 2016-08-10 ENCOUNTER — Ambulatory Visit: Payer: Medicare Other | Admitting: Psychiatry

## 2016-08-14 DIAGNOSIS — M6281 Muscle weakness (generalized): Secondary | ICD-10-CM | POA: Diagnosis not present

## 2016-08-14 DIAGNOSIS — M542 Cervicalgia: Secondary | ICD-10-CM | POA: Diagnosis not present

## 2016-08-16 ENCOUNTER — Ambulatory Visit (INDEPENDENT_AMBULATORY_CARE_PROVIDER_SITE_OTHER): Payer: Medicare Other | Admitting: Licensed Clinical Social Worker

## 2016-08-16 DIAGNOSIS — F411 Generalized anxiety disorder: Secondary | ICD-10-CM

## 2016-08-16 DIAGNOSIS — F429 Obsessive-compulsive disorder, unspecified: Secondary | ICD-10-CM

## 2016-08-16 DIAGNOSIS — M542 Cervicalgia: Secondary | ICD-10-CM | POA: Diagnosis not present

## 2016-08-16 NOTE — Progress Notes (Signed)
   THERAPIST PROGRESS NOTE  Session Time: 3 PM to 3:55 PM  Participation Level: Active  Behavioral Response: CasualAlertEuthymic  Type of Therapy: Individual Therapy  Treatment Goals addressed:  decrease in anxiety and OCD symptoms  Interventions: CBT, Solution Focused, Biofeedback, Anger Management Training and Other: Coping strategies for anxiety, effective interpersonal skills  Summary: Vanessa Vaughn is a 75 y.o. female who presents with problems with her family have worked out and things are settled. She has a better understanding of her brother's wife's motivation and personality. Relates that she has the right to do what she wants and she is at peace with it. Patient spirituality as a resource for her and effective coping. Reviewed stressors in patient's main stressor is that in the neck that has been hurting her for the past 3 months. She is on prednisone and her blood pressure has been going up. She thinks that physical therapy and massage therapies is going how. She recognizes tics with physical pain but also realizes that tics started a year ago before the pain. Patient discussed a recent incident where she flushed when all the attention was on her. She notices that her pastor is avoiding her and she doesn't want people to avoid her because of it. Discussed with therapist ways that she can normalize that in explaining the flushing to people. Patient had insight and remembers an incident where her mom focused attention on her to to deflect from that she was doing and asked patient what was wrong. Patient shared that she just wanted to leave and as a child she adds still not develop the skills to handle the situation. Patient shared about her upbringing and related that she grew up "crazy". Her mom was bipolar the Way the family cope was to ignore her or that she recognizes that it had a negative impact on her. Dad was good, her older sister reacted by being negative and she was with  bipolar and her younger sister was sick always. Patient was happy-go-lucky but also had OCD type symptoms at that time where she had habits of voluntary movements of the face that would help her calm down. Discussed with therapist how OCD symptoms can be a way to have some control over anxiety and situations where one does not feel in control. Completed treatment plan and patient wants to work on decreasing anxiety, OCD and to unburden herself from things from the past that are still having an impact on her.   Suicidal/Homicidal: No  Therapist Response: Therapist reviewed progress and current symptoms. Provided positive feedback for working through anger toward family members and applying healthy coping strategies to this relationship. Completed treatment plan and patient wants to work on decrease of anxiety and OCD and unload herself from issues that have been dragging her down in life. Reviewed triggers that cause tics and anxiety. Identified sources of unhelpful schemas from childhood that patient will have to work on to change. Reviewed worksheet titled "negative self talk worksheet" the patient can work on challenging unhelpfulness self talk. Reviewed worksheet "what are interpersonal schemas?" To help patient understand sources of maladaptive schemas. Provided supportive and strength-based interventions.  Plan: Return again in 1 week.2.patient will review handout on "what are interpersonal schemas?", and "negative self talk worksheet"  Diagnosis: Axis I:  obsessive-compulsive disorder, generalized anxiety disorder     Axis II: No diagnosis    Tomio Kirk A, LCSW 08/16/2016

## 2016-08-18 DIAGNOSIS — M549 Dorsalgia, unspecified: Secondary | ICD-10-CM | POA: Diagnosis not present

## 2016-08-18 DIAGNOSIS — J3489 Other specified disorders of nose and nasal sinuses: Secondary | ICD-10-CM | POA: Diagnosis not present

## 2016-08-18 DIAGNOSIS — E7800 Pure hypercholesterolemia, unspecified: Secondary | ICD-10-CM | POA: Diagnosis not present

## 2016-08-18 DIAGNOSIS — E039 Hypothyroidism, unspecified: Secondary | ICD-10-CM | POA: Diagnosis not present

## 2016-08-23 ENCOUNTER — Encounter: Payer: Self-pay | Admitting: Psychiatry

## 2016-08-23 ENCOUNTER — Ambulatory Visit (INDEPENDENT_AMBULATORY_CARE_PROVIDER_SITE_OTHER): Payer: Medicare Other | Admitting: Psychiatry

## 2016-08-23 VITALS — BP 130/72 | HR 71 | Ht 64.25 in | Wt 140.0 lb

## 2016-08-23 DIAGNOSIS — F411 Generalized anxiety disorder: Secondary | ICD-10-CM

## 2016-08-23 DIAGNOSIS — F429 Obsessive-compulsive disorder, unspecified: Secondary | ICD-10-CM | POA: Diagnosis not present

## 2016-08-23 DIAGNOSIS — M25512 Pain in left shoulder: Secondary | ICD-10-CM | POA: Diagnosis not present

## 2016-08-23 DIAGNOSIS — M542 Cervicalgia: Secondary | ICD-10-CM | POA: Diagnosis not present

## 2016-08-23 DIAGNOSIS — M6281 Muscle weakness (generalized): Secondary | ICD-10-CM | POA: Diagnosis not present

## 2016-08-23 DIAGNOSIS — M25511 Pain in right shoulder: Secondary | ICD-10-CM | POA: Diagnosis not present

## 2016-08-23 MED ORDER — SERTRALINE HCL 50 MG PO TABS: 50.0000 mg | ORAL_TABLET | Freq: Every day | ORAL | 2 refills | Status: DC

## 2016-08-23 NOTE — Progress Notes (Signed)
Patient ID: Vanessa Vaughn, female   DOB: Aug 07, 1941, 75 y.o.   MRN: [id number]  [facility] MD/PA/NP OP Progress Note  08/23/2016 11:03 AM AYSHA LANDRO  MRN:  [id number]  Subjective:  Patient returns for follow-up of her obsessive-compulsive disorder. She has been able to tolerate the increase in Zoloft. Her anxiety and OCD are much better. She reports having more tics today, having some trouble with her blood pressure. She continues to enjoy church activities and spending time with her family.   Chief Complaint: Doing well  Visit Diagnosis:     ICD-9-CM ICD-10-CM   1. GAD (generalized anxiety disorder) 300.02 F41.1   2. Obsessive-compulsive disorder, unspecified type 300.3 F42.9     Past Medical History:  Past Medical History:  Diagnosis Date  . Anxiety   . Chronic back pain    stenosis  . Constipation   . Hx of migraines    5+yrs ago;pt states caused by Lipitor  . Hyperlipidemia    takes Simvastatin daily  . Hypothyroidism    takes Synthroid daily  . Insomnia    takes Ambien prn  . Muscle spasm    takes Robaxin prn  . PONV (postoperative nausea and vomiting)   . Urinary frequency    pt states related to meds    Past Surgical History:  Procedure Laterality Date  . COLONOSCOPY    . LUMBAR LAMINECTOMY/DECOMPRESSION MICRODISCECTOMY  03/19/2012   Procedure: LUMBAR LAMINECTOMY/DECOMPRESSION MICRODISCECTOMY 2 LEVELS;  Surgeon: Charlie Pitter, MD;  Location: [facility];  Service: Neurosurgery;  Laterality: Bilateral;  Bilateral Lumbar Two-Four Decompressive Laminectomy  . THYROIDECTOMY, PARTIAL    . TUBAL LIGATION     Family History:  Family History  Problem Relation Age of Onset  . Anesthesia problems Neg Hx   . Hypotension Neg Hx   . Malignant hyperthermia Neg Hx   . Pseudochol deficiency Neg Hx   . Breast cancer Sister 49  . Anxiety disorder Mother   . Heart disease Father   . Angina Father   . Heart failure Father   . Alcohol abuse Brother    Social History:   Social History   Social History  . Marital status: Divorced    Spouse name: N/A  . Number of children: N/A  . Years of education: N/A   Social History Main Topics  . Smoking status: Former Smoker    Start date: 09/29/1959    Quit date: 10/16/1984  . Smokeless tobacco: Never Used     Comment: quit in 1986  . Alcohol use 0.6 oz/week    1 Glasses of wine per week     Comment: occasionally wine  . Drug use: No  . Sexual activity: Not Currently    Birth control/ protection: Post-menopausal   Other Topics Concern  . None   Social History Narrative  . None   Additional History:   Assessment:   Musculoskeletal: Strength & Muscle Tone: within normal limits Gait & Station: normal Patient leans: N/A  Psychiatric Specialty Exam: Medication Refill     ROS  Blood pressure 130/72, pulse 71, height 5' 4.25" (1.632 m), weight 140 lb (63.5 kg).Body mass index is 23.84 kg/m.  General Appearance: Well Groomed  Eye Contact:  Good  Speech:  Normal Rate  Volume:  Normal  Mood:  Good  Affect:  wnl  Thought Process:  Linear and Logical  Orientation:  Full (Time, Place, and Person)  Thought Content:  Negative  Suicidal Thoughts:  No  Homicidal  Thoughts:  No  Memory:  Immediate;   Good Recent;   Good Remote;   Good  Judgement:  Good  Insight:  Good  Psychomotor Activity:  Negative  Concentration:  Good  Recall:  Good  Fund of Knowledge: Good  Language: Good  Akathisia:  Negative  Handed:    AIMS (if indicated):    Assets:  Communication Skills Desire for Improvement  ADL's:  Intact  Cognition: WNL  Sleep:  good   Is the patient at risk to self?  No. Has the patient been a risk to self in the past 6 months?  No. Has the patient been a risk to self within the distant past?  No. Is the patient a risk to others?  No. Has the patient been a risk to others in the past 6 months?  No. Has the patient been a risk to others within the distant past?  No.  Current  Medications: Current Outpatient Prescriptions  Medication Sig Dispense Refill  . levothyroxine (SYNTHROID, LEVOTHROID) 88 MCG tablet Take 88 mcg by mouth daily.    . meloxicam (MOBIC) 7.5 MG tablet Take 7.5 mg by mouth daily.    . sertraline (ZOLOFT) 50 MG tablet Take 1 tablet (50 mg total) by mouth daily. 30 tablet 1  . simvastatin (ZOCOR) 20 MG tablet Take 20 mg by mouth every evening.     No current facility-administered medications for this visit.     Medical Decision Making:  Established Problem, Stable/Improving (1) and Review of Medication Regimen & Side Effects (2)  Treatment Plan Summary:Medication management and Plan Plan  Obsessive-compulsive disorder- Continue sertraline at 50mg  po qd to address obsessive symptoms.  Insomnia- resolved  She will follow-up in 3 months. She's been encouraged call any questions or concerns prior to her next appointment.  Isiaah Cuervo 08/23/2016, 11:03 AM

## 2016-08-25 DIAGNOSIS — M542 Cervicalgia: Secondary | ICD-10-CM | POA: Diagnosis not present

## 2016-08-25 DIAGNOSIS — E039 Hypothyroidism, unspecified: Secondary | ICD-10-CM | POA: Diagnosis not present

## 2016-08-25 DIAGNOSIS — M6281 Muscle weakness (generalized): Secondary | ICD-10-CM | POA: Diagnosis not present

## 2016-08-25 DIAGNOSIS — R030 Elevated blood-pressure reading, without diagnosis of hypertension: Secondary | ICD-10-CM | POA: Diagnosis not present

## 2016-08-25 DIAGNOSIS — E7800 Pure hypercholesterolemia, unspecified: Secondary | ICD-10-CM | POA: Diagnosis not present

## 2016-08-30 DIAGNOSIS — M542 Cervicalgia: Secondary | ICD-10-CM | POA: Diagnosis not present

## 2016-08-30 DIAGNOSIS — M6281 Muscle weakness (generalized): Secondary | ICD-10-CM | POA: Diagnosis not present

## 2016-09-01 DIAGNOSIS — M6281 Muscle weakness (generalized): Secondary | ICD-10-CM | POA: Diagnosis not present

## 2016-09-01 DIAGNOSIS — M542 Cervicalgia: Secondary | ICD-10-CM | POA: Diagnosis not present

## 2016-09-11 DIAGNOSIS — E7800 Pure hypercholesterolemia, unspecified: Secondary | ICD-10-CM | POA: Diagnosis not present

## 2016-09-11 DIAGNOSIS — R030 Elevated blood-pressure reading, without diagnosis of hypertension: Secondary | ICD-10-CM | POA: Diagnosis not present

## 2016-09-11 DIAGNOSIS — M542 Cervicalgia: Secondary | ICD-10-CM | POA: Diagnosis not present

## 2016-09-13 DIAGNOSIS — M6281 Muscle weakness (generalized): Secondary | ICD-10-CM | POA: Diagnosis not present

## 2016-09-13 DIAGNOSIS — M542 Cervicalgia: Secondary | ICD-10-CM | POA: Diagnosis not present

## 2016-09-14 ENCOUNTER — Ambulatory Visit (INDEPENDENT_AMBULATORY_CARE_PROVIDER_SITE_OTHER): Payer: Medicare Other | Admitting: Licensed Clinical Social Worker

## 2016-09-14 DIAGNOSIS — F411 Generalized anxiety disorder: Secondary | ICD-10-CM

## 2016-09-14 DIAGNOSIS — F429 Obsessive-compulsive disorder, unspecified: Secondary | ICD-10-CM
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
## 2016-09-18 DIAGNOSIS — M25512 Pain in left shoulder: Secondary | ICD-10-CM | POA: Diagnosis not present

## 2016-09-18 DIAGNOSIS — M25511 Pain in right shoulder: Secondary | ICD-10-CM | POA: Diagnosis not present

## 2016-09-18 DIAGNOSIS — M542 Cervicalgia: Secondary | ICD-10-CM | POA: Diagnosis not present

## 2016-09-18 DIAGNOSIS — M6281 Muscle weakness (generalized): Secondary | ICD-10-CM | POA: Diagnosis not present

## 2016-09-25 DIAGNOSIS — M6281 Muscle weakness (generalized): Secondary | ICD-10-CM | POA: Diagnosis not present

## 2016-09-26 DIAGNOSIS — M4802 Spinal stenosis, cervical region: Secondary | ICD-10-CM | POA: Diagnosis not present

## 2016-09-26 DIAGNOSIS — M5412 Radiculopathy, cervical region: Secondary | ICD-10-CM | POA: Diagnosis not present

## 2016-09-26 DIAGNOSIS — M4722 Other spondylosis with radiculopathy, cervical region: Secondary | ICD-10-CM | POA: Diagnosis not present

## 2016-09-26 DIAGNOSIS — M5030 Other cervical disc degeneration, unspecified cervical region: Secondary | ICD-10-CM | POA: Diagnosis not present

## 2016-10-26 ENCOUNTER — Ambulatory Visit (INDEPENDENT_AMBULATORY_CARE_PROVIDER_SITE_OTHER): Payer: Medicare Other | Admitting: Licensed Clinical Social Worker

## 2016-10-26 DIAGNOSIS — F411 Generalized anxiety disorder: Secondary | ICD-10-CM | POA: Diagnosis not present

## 2016-10-26 DIAGNOSIS — F429 Obsessive-compulsive disorder, unspecified: Secondary | ICD-10-CM | POA: Diagnosis not present

## 2016-10-26 NOTE — Progress Notes (Signed)
   THERAPIST PROGRESS NOTE  Session Time: 10 AM to 10:55 AM  Participation Level: Active  Behavioral Response: CasualAlertEuthymic  Type of Therapy: Individual Therapy  Treatment Goals addressed:  decrease in anxiety and OCD symptoms  Interventions: Motivational Interviewing, Solution Focused, Strength-based, Supportive and Other: Healthy interpersonal relationships  Summary: Vanessa Vaughn is a 76 y.o. female who presents with enjoying spending special time with her sister and her house she enjoys and describes as a Designer, multimedia. She indicated to the guy at church that what he had done at her feelings and says things are better once again in their relationship. She has learned strategies since being in therapy that have helped including to act when the situation happens instead of avoiding it. Shared she realizes to deal with things and not to fret and if not in that that minute plan to deal with it. She got up and spoke on New Year's and loved it. Discussed the more experience you have the more you understand to be so concerned about what other people think as they are often thinking about themselves. Related that often we're concerned about others because were to much thinking about ourselves. Discussed deep breathing as a useful strategy for dealing with situations that make her anxious. Shared another experience where she felt good where she got up and shared her Christmas card. Described the notion of "leaning into the suck" which means that the less we fight our negative emotions the easier it is to get through them. Recognize baggage as carrying issues that are not your issues  Suicidal/Homicidal: No  Therapist Response: Reviewed progress and symptoms and provided positive feedback for patient and implementing healthy interpersonal skills. Reaffirmed patient's insight that avoidance is not helpful in dealing with things is more effective. Described concept of "learning into the suck" which  means accepting negative feelings helped them to go away quicker. Described perspective taking with more accurate view that things are not as significant as we believed him to be as helpful as well as recognizing other people are not so focused on what we're doing but more focused on themselves. Discussed deep breathing as helpful for patient's anxiety in social situations. Discussed baggage we develop in life and taking on issues that are not our own issues.    Plan: Return again in 4 weeks.2 patient will review handout on focused breathing.3. Patient continues to gain insight to coping strategies to manage emotions and implement into life situations.  Diagnosis: Axis I:  obsessive-compulsive disorder, generalized anxiety disorder     Axis II: No diagnosis    Shenique Childers A, LCSW 10/26/2016

## 2016-10-30 ENCOUNTER — Other Ambulatory Visit: Payer: Self-pay | Admitting: Psychiatry

## 2016-11-08 ENCOUNTER — Telehealth: Payer: Self-pay

## 2016-11-08 ENCOUNTER — Other Ambulatory Visit: Payer: Self-pay | Admitting: Physical Medicine and Rehabilitation

## 2016-11-08 DIAGNOSIS — M5412 Radiculopathy, cervical region: Secondary | ICD-10-CM

## 2016-11-08 NOTE — Telephone Encounter (Signed)
called in rx for zoloft 50 mg for a 90 day supply with no additional refills.

## 2016-11-08 NOTE — Telephone Encounter (Signed)
pt called states she needs a refill on her zoloft

## 2016-11-17 ENCOUNTER — Ambulatory Visit
Admission: RE | Admit: 2016-11-17 | Discharge: 2016-11-17 | Disposition: A | Discharge status: Home / Self Care | Payer: Medicare Other | Source: Ambulatory Visit | Attending: Physical Medicine and Rehabilitation | Admitting: Physical Medicine and Rehabilitation

## 2016-11-17 DIAGNOSIS — M5412 Radiculopathy, cervical region: Secondary | ICD-10-CM | POA: Diagnosis not present

## 2016-11-17 DIAGNOSIS — M5031 Other cervical disc degeneration,  high cervical region: Secondary | ICD-10-CM | POA: Insufficient documentation

## 2016-11-17 DIAGNOSIS — M47892 Other spondylosis, cervical region: Secondary | ICD-10-CM | POA: Diagnosis not present

## 2016-11-17 DIAGNOSIS — M1288 Other specific arthropathies, not elsewhere classified, other specified site: Secondary | ICD-10-CM | POA: Insufficient documentation

## 2016-11-17 DIAGNOSIS — M542 Cervicalgia: Secondary | ICD-10-CM | POA: Diagnosis not present

## 2016-11-17 DIAGNOSIS — M4802 Spinal stenosis, cervical region: Secondary | ICD-10-CM | POA: Insufficient documentation

## 2016-11-20 DIAGNOSIS — Z23 Encounter for immunization: Secondary | ICD-10-CM | POA: Diagnosis not present

## 2016-11-23 ENCOUNTER — Encounter: Payer: Self-pay | Admitting: Psychiatry

## 2016-11-23 ENCOUNTER — Ambulatory Visit: Payer: Medicare Other | Admitting: Psychiatry

## 2016-11-23 VITALS — BP 138/79 | HR 71 | Temp 97.5°F | Wt 145.6 lb

## 2016-11-23 DIAGNOSIS — F411 Generalized anxiety disorder: Secondary | ICD-10-CM

## 2016-11-23 DIAGNOSIS — F429 Obsessive-compulsive disorder, unspecified: Secondary | ICD-10-CM

## 2016-11-23 MED ORDER — SERTRALINE HCL 50 MG PO TABS: 50.0000 mg | ORAL_TABLET | Freq: Every day | ORAL | 2 refills | Status: DC

## 2016-11-23 NOTE — Progress Notes (Signed)
Patient ID: Vanessa Vaughn, female   DOB: 27-Jun-1941, 76 y.o.   MRN: [id number]  [facility] MD/PA/NP OP Progress Note  11/23/2016 10:47 AM Vanessa Vaughn  MRN:  [id number]  Subjective:  Patient returns for follow-up of her obsessive-compulsive disorder. She reports that she has been doing quite well. She denies any side effects from medication. Continues to report a good relationship with her son. Denies any suicidal thoughts. Feels like her obsessive-compulsive symptoms are under control. She has been seeing Stanton Kidney in therapy and is going well. Chief Complaint: Doing well  Visit Diagnosis:     ICD-9-CM ICD-10-CM   1. GAD (generalized anxiety disorder) 300.02 F41.1   2. Obsessive-compulsive disorder, unspecified type 300.3 F42.9     Past Medical History:  Past Medical History:  Diagnosis Date  . Anxiety   . Chronic back pain    stenosis  . Constipation   . Hx of migraines    5+yrs ago;pt states caused by Lipitor  . Hyperlipidemia    takes Simvastatin daily  . Hypothyroidism    takes Synthroid daily  . Insomnia    takes Ambien prn  . Muscle spasm    takes Robaxin prn  . PONV (postoperative nausea and vomiting)   . Urinary frequency    pt states related to meds    Past Surgical History:  Procedure Laterality Date  . COLONOSCOPY    . LUMBAR LAMINECTOMY/DECOMPRESSION MICRODISCECTOMY  03/19/2012   Procedure: LUMBAR LAMINECTOMY/DECOMPRESSION MICRODISCECTOMY 2 LEVELS;  Surgeon: Charlie Pitter, MD;  Location: [facility];  Service: Neurosurgery;  Laterality: Bilateral;  Bilateral Lumbar Two-Four Decompressive Laminectomy  . THYROIDECTOMY, PARTIAL    . TUBAL LIGATION     Family History:  Family History  Problem Relation Age of Onset  . Anesthesia problems Neg Hx   . Hypotension Neg Hx   . Malignant hyperthermia Neg Hx   . Pseudochol deficiency Neg Hx   . Breast cancer Sister 39  . Anxiety disorder Mother   . Heart disease Father   . Angina Father   . Heart failure Father   .  Alcohol abuse Brother    Social History:  Social History   Social History  . Marital status: Divorced    Spouse name: N/A  . Number of children: N/A  . Years of education: N/A   Social History Main Topics  . Smoking status: Former Smoker    Start date: 09/29/1959    Quit date: 10/16/1984  . Smokeless tobacco: Never Used     Comment: quit in 1986  . Alcohol use 0.6 oz/week    1 Glasses of wine per week     Comment: occasionally wine  . Drug use: No  . Sexual activity: Not Currently    Birth control/ protection: Post-menopausal   Other Topics Concern  . Not on file   Social History Narrative  . No narrative on file   Additional History:   Assessment:   Musculoskeletal: Strength & Muscle Tone: within normal limits Gait & Station: normal Patient leans: N/A  Psychiatric Specialty Exam: Medication Refill     ROS  There were no vitals taken for this visit.There is no height or weight on file to calculate BMI.  General Appearance: Well Groomed  Eye Contact:  Good  Speech:  Normal Rate  Volume:  Normal  Mood:  Good  Affect:  wnl  Thought Process:  Linear and Logical  Orientation:  Full (Time, Place, and Person)  Thought Content:  Negative  Suicidal Thoughts:  No  Homicidal Thoughts:  No  Memory:  Immediate;   Good Recent;   Good Remote;   Good  Judgement:  Good  Insight:  Good  Psychomotor Activity:  Negative  Concentration:  Good  Recall:  Good  Fund of Knowledge: Good  Language: Good  Akathisia:  Negative  Handed:    AIMS (if indicated):    Assets:  Communication Skills Desire for Improvement  ADL's:  Intact  Cognition: WNL  Sleep:  good   Is the patient at risk to self?  No. Has the patient been a risk to self in the past 6 months?  No. Has the patient been a risk to self within the distant past?  No. Is the patient a risk to others?  No. Has the patient been a risk to others in the past 6 months?  No. Has the patient been a risk to others  within the distant past?  No.  Current Medications: Current Outpatient Prescriptions  Medication Sig Dispense Refill  . levothyroxine (SYNTHROID, LEVOTHROID) 88 MCG tablet Take 88 mcg by mouth daily.    . meloxicam (MOBIC) 7.5 MG tablet Take 7.5 mg by mouth daily.    . sertraline (ZOLOFT) 50 MG tablet Take 1 tablet (50 mg total) by mouth daily. 30 tablet 2  . simvastatin (ZOCOR) 20 MG tablet Take 20 mg by mouth every evening.     No current facility-administered medications for this visit.     Medical Decision Making:  Established Problem, Stable/Improving (1) and Review of Medication Regimen & Side Effects (2)  Treatment Plan Summary:Medication management and Plan Plan  Obsessive-compulsive disorder- Continue sertraline at 50mg  po qd to address obsessive symptoms.  Insomnia- resolved  She will follow-up in 3 months. She's been encouraged call any questions or concerns prior to her next appointment.  Shaquayla Klimas 11/23/2016, 10:47 AM

## 2016-11-29 DIAGNOSIS — M5030 Other cervical disc degeneration, unspecified cervical region: Secondary | ICD-10-CM | POA: Diagnosis not present

## 2016-11-29 DIAGNOSIS — M4802 Spinal stenosis, cervical region: Secondary | ICD-10-CM | POA: Diagnosis not present

## 2016-11-29 DIAGNOSIS — M5412 Radiculopathy, cervical region: Secondary | ICD-10-CM | POA: Diagnosis not present

## 2016-11-30 ENCOUNTER — Ambulatory Visit (INDEPENDENT_AMBULATORY_CARE_PROVIDER_SITE_OTHER): Payer: Medicare Other | Admitting: Licensed Clinical Social Worker

## 2016-11-30 DIAGNOSIS — F429 Obsessive-compulsive disorder, unspecified: Secondary | ICD-10-CM

## 2016-11-30 DIAGNOSIS — F411 Generalized anxiety disorder: Secondary | ICD-10-CM | POA: Diagnosis not present

## 2016-11-30 NOTE — Progress Notes (Signed)
   THERAPIST PROGRESS NOTE  Session Time: 11:05 AM to 11:55 AM  Participation Level: Active  Behavioral Response: CasualAlertEuthymic  Type of Therapy: Individual Therapy  Treatment Goals addressed: decrease in anxiety and OCD symptoms, coping strategies to manage mood  Interventions: Solution Focused, Strength-based, Supportive and Family Systems  Summary: Vanessa Vaughn is a 76 y.o. female who presents with doing good. She had an MRI last week, has found out that the nagging pain for the past year has been related to bulging vertebrate. This may play a part in her twitching. Her doctors told her that they will try everything including medication and therapy before they consider surgery and right now she has been prescribed tramadol. She relates that she's doing fine and she is just a happy person. She has got his bed and Mrs. help motivate her to exercise. Reviewed treatment plan and patient has Probation officer perspective of her past, discussed having a childhood view that can be frozen in time and now she can more accurately assess as an adult he past. She identifies the feeling of being trapped as a kid and mom with mental health issues. She identifies her father as being a "mainstay". Discussed other family dynamics and has a sister who also was helpful in giving her insight and support.  Patient identifies the feeling of being trapped, taking focus off herself, put yourself positive, and we have the ability to do that. Patient will continue to work on coping strategies for mood and strategies to help decrease in anxiety and OCD symptoms.  Suicidal/Homicidal: No  Therapist Response: Reviewed patient's symptoms and progress. Reviewed treatment plan and identified that patient has been working on unhelpful schemas develop when she was she on, identifying a possible underlying physical cause related to twitching and putting herself into situations that cause anxiety. She is intentionally changing the  way she is doing things. She will continue to work on goals by working on coping strategies to manage mood, anxiety and OCD  Plan: Return again in 6 weeks.  Diagnosis: Axis I: obsessive-compulsive disorder, generalized anxiety disorder     Axis II: No diagnosis    Keighan Amezcua A, LCSW 11/30/2016

## 2017-01-18 ENCOUNTER — Ambulatory Visit (INDEPENDENT_AMBULATORY_CARE_PROVIDER_SITE_OTHER): Payer: Medicare Other | Admitting: Licensed Clinical Social Worker

## 2017-01-18 DIAGNOSIS — F411 Generalized anxiety disorder: Secondary | ICD-10-CM

## 2017-01-18 DIAGNOSIS — F429 Obsessive-compulsive disorder, unspecified: Secondary | ICD-10-CM

## 2017-01-18 NOTE — Progress Notes (Signed)
   THERAPIST PROGRESS NOTE  Session Time: 11:02 AM to 12:00 PM  Participation Level: Active  Behavioral Response: CasualAlertEuthymic  Type of Therapy: Individual Therapy  Treatment Goals addressed: decrease in anxiety and OCD symptoms, coping strategies to manage mood  Interventions: Solution Focused, Strength-based, Supportive and Other: coping strategies for anxiety and mood  Summary: Vanessa Vaughn is a 76 y.o. female who presents with relating to therapist she is enjoying life. [name] is fun and gets closer to God all the time. She gave a speech at church, not nervous and it went well. She didn't worry about blushing. Shared that when someone wants her to do it, she does it. She is going to cook dinner for people at church and excited about it. Discussed bulging disc and how it could be related to tics. She takes Tramadol, pain is a 2-3, not too bad and takes Aspirin if bothers her. If they do surgery which is last option doctor will fuse all discs together and she does not want to have to do this. Explored history of tics and relates that it has gotten worse in the last few years but can not identify any triggers. She does not identify any obsessions. She did have back surgery in 2013. She denies obsessions. Childhood was pretty bad but she was always busy and always happy. She stayed away from sister that was nasty but not afraid of her. Mom had mental health issues that involved fear all her life and afraid to go certain places. She doesn't think there are any unresolved emotional issues. Sisters talk about everything that happened. Described an experience when younger of unloading things. Explored with patient and she relates getting Ether at dentist and then having ongoing nightmares was what unloaded. She had no one to talk to but spirituality helped to resolve. resolve.Suicidal/Homicidal: No  Therapist Response: Reviewed progress and symptoms and patient reports that she is doing well.  Discussed how doing things for others provides meaning and purpose and also is good coping for an mental health. Explored origins of tics and patient does not identify any obsessional thinking. Explored childhood and patient does not think she has unresolved issues and has sisters to talk to about what happened. Provided positive feedback for patient getting up and giving a speech and not worrying about her blushing. Identified her spirituality as one of her main coping. Provided strength based and supportive interventions.   Plan: 1.Patient will schedule once therapist returrns from leave. 2.Patient will continue to gain insight to coping strategies to manage mood and anxiety to continue to progress in treatment.3.Patient continue to expose herself to situations where she has felt some anxiety to help her improve anxiety symptoms  Diagnosis: Axis I: obsessive-compulsive disorder, generalized anxiety disorder     Axis II: No diagnosis    Cristobal Advani A, LCSW 01/18/2017

## 2017-01-22 DIAGNOSIS — M5030 Other cervical disc degeneration, unspecified cervical region: Secondary | ICD-10-CM | POA: Diagnosis not present

## 2017-01-22 DIAGNOSIS — M4802 Spinal stenosis, cervical region: Secondary | ICD-10-CM | POA: Diagnosis not present

## 2017-01-22 DIAGNOSIS — M5412 Radiculopathy, cervical region: Secondary | ICD-10-CM | POA: Diagnosis not present

## 2017-02-15 ENCOUNTER — Ambulatory Visit (INDEPENDENT_AMBULATORY_CARE_PROVIDER_SITE_OTHER): Payer: Medicare Other | Admitting: Psychiatry

## 2017-02-15 ENCOUNTER — Encounter: Payer: Self-pay | Admitting: Psychiatry

## 2017-02-15 VITALS — BP 157/79 | HR 73 | Temp 98.4°F | Wt 142.4 lb

## 2017-02-15 DIAGNOSIS — F429 Obsessive-compulsive disorder, unspecified: Secondary | ICD-10-CM | POA: Diagnosis not present

## 2017-02-15 DIAGNOSIS — F411 Generalized anxiety disorder: Secondary | ICD-10-CM | POA: Diagnosis not present

## 2017-02-15 NOTE — Progress Notes (Signed)
Patient ID: Vanessa Vaughn, female   DOB: [birth year], 76 y.o.   MRN: [id number]  [facility] MD/PA/NP OP Progress Note  02/15/2017 10:16 AM Vanessa Vaughn  MRN:  [id number]  Subjective:  Patient returns for follow-up of her obsessive-compulsive disorder. She reports that she has been doing quite well. She denies any side effects from medication. Continues to report a good relationship with her son. Denies any suicidal thoughts. Feels like her obsessive-compulsive symptoms are under control. She will start to see [facility] after she comes back from her leave and reports it has been going well. States that down her faith keeps him going and she enjoys spending time with her church friends.  Chief Complaint: Doing well Chief Complaint    Follow-up; Medication Refill     Visit Diagnosis:     ICD-9-CM ICD-10-CM   1. GAD (generalized anxiety disorder) 300.02 F41.1   2. Obsessive-compulsive disorder, unspecified type 300.3 F42.9     Past Medical History:  Past Medical History:  Diagnosis Date  . Anxiety   . Chronic back pain    stenosis  . Constipation   . Hx of migraines    5+yrs ago;pt states caused by Lipitor  . Hyperlipidemia    takes Simvastatin daily  . Hypothyroidism    takes Synthroid daily  . Insomnia    takes Ambien prn  . Muscle spasm    takes Robaxin prn  . PONV (postoperative nausea and vomiting)   . Urinary frequency    pt states related to meds    Past Surgical History:  Procedure Laterality Date  . COLONOSCOPY    . LUMBAR LAMINECTOMY/DECOMPRESSION MICRODISCECTOMY  03/19/2012   Procedure: LUMBAR LAMINECTOMY/DECOMPRESSION MICRODISCECTOMY 2 LEVELS;  Surgeon: Charlie Pitter, MD;  Location: [place] [facility];  Service: Neurosurgery;  Laterality: Bilateral;  Bilateral Lumbar Two-Four Decompressive Laminectomy  . THYROIDECTOMY, PARTIAL    . TUBAL LIGATION     Family History:  Family History  Problem Relation Age of Onset  . Breast cancer Sister 61  . Anxiety disorder Mother   .  Heart disease Father   . Angina Father   . Heart failure Father   . Alcohol abuse Brother   . Anesthesia problems Neg Hx   . Hypotension Neg Hx   . Malignant hyperthermia Neg Hx   . Pseudochol deficiency Neg Hx    Social History:  Social History   Social History  . Marital status: Divorced    Spouse name: N/A  . Number of children: N/A  . Years of education: N/A   Social History Main Topics  . Smoking status: Former Smoker    Start date: 09/29/1959    Quit date: 10/16/1984  . Smokeless tobacco: Never Used     Comment: quit in 1986  . Alcohol use 0.6 oz/week    1 Glasses of wine per week     Comment: occasionally wine  . Drug use: No  . Sexual activity: Not Currently    Birth control/ protection: Post-menopausal   Other Topics Concern  . None   Social History Narrative  . None   Additional History:   Assessment:   Musculoskeletal: Strength & Muscle Tone: within normal limits Gait & Station: normal Patient leans: N/A  Psychiatric Specialty Exam: Medication Refill     ROS  Blood pressure (!) 157/79, pulse 73, temperature 98.4 F (36.9 C), temperature source Oral, weight 142 lb 6.4 oz (64.6 kg).Body mass index is 24.25 kg/m.  General Appearance: Well Groomed  Eye Contact:  Good  Speech:  Normal Rate  Volume:  Normal  Mood:  Good  Affect:  wnl  Thought Process:  Linear and Logical  Orientation:  Full (Time, Place, and Person)  Thought Content:  Negative  Suicidal Thoughts:  No  Homicidal Thoughts:  No  Memory:  Immediate;   Good Recent;   Good Remote;   Good  Judgement:  Good  Insight:  Good  Psychomotor Activity:  Negative  Concentration:  Good  Recall:  Good  Fund of Knowledge: Good  Language: Good  Akathisia:  Negative  Handed:    AIMS (if indicated):    Assets:  Communication Skills Desire for Improvement  ADL's:  Intact  Cognition: WNL  Sleep:  good   Is the patient at risk to self?  No. Has the patient been a risk to self in the past  6 months?  No. Has the patient been a risk to self within the distant past?  No. Is the patient a risk to others?  No. Has the patient been a risk to others in the past 6 months?  No. Has the patient been a risk to others within the distant past?  No.  Current Medications: Current Outpatient Prescriptions  Medication Sig Dispense Refill  . levothyroxine (SYNTHROID, LEVOTHROID) 88 MCG tablet Take 88 mcg by mouth daily.    . meloxicam (MOBIC) 7.5 MG tablet Take 7.5 mg by mouth daily.    . sertraline (ZOLOFT) 50 MG tablet Take 1 tablet (50 mg total) by mouth daily. 90 tablet 2  . simvastatin (ZOCOR) 20 MG tablet Take 20 mg by mouth every evening.     No current facility-administered medications for this visit.     Medical Decision Making:  Established Problem, Stable/Improving (1) and Review of Medication Regimen & Side Effects (2)  Treatment Plan Summary:Medication management and Plan Plan  Obsessive-compulsive disorder- Continue sertraline at 50mg  po qd to address obsessive symptoms.  Insomnia- resolved  She will follow-up in 3 months. She's been encouraged call any questions or concerns prior to her next appointment.  Niko Jakel 02/15/2017, 10:16 AM

## 2017-02-16 DIAGNOSIS — R030 Elevated blood-pressure reading, without diagnosis of hypertension: Secondary | ICD-10-CM | POA: Diagnosis not present

## 2017-02-16 DIAGNOSIS — E039 Hypothyroidism, unspecified: Secondary | ICD-10-CM | POA: Diagnosis not present

## 2017-02-16 DIAGNOSIS — E7800 Pure hypercholesterolemia, unspecified: Secondary | ICD-10-CM | POA: Diagnosis not present

## 2017-02-16 DIAGNOSIS — M542 Cervicalgia: Secondary | ICD-10-CM | POA: Diagnosis not present

## 2017-02-23 DIAGNOSIS — E039 Hypothyroidism, unspecified: Secondary | ICD-10-CM | POA: Diagnosis not present

## 2017-02-23 DIAGNOSIS — Z0000 Encounter for general adult medical examination without abnormal findings: Secondary | ICD-10-CM | POA: Diagnosis not present

## 2017-02-23 DIAGNOSIS — Z1231 Encounter for screening mammogram for malignant neoplasm of breast: Secondary | ICD-10-CM | POA: Diagnosis not present

## 2017-02-23 DIAGNOSIS — Z780 Asymptomatic menopausal state: Secondary | ICD-10-CM | POA: Diagnosis not present

## 2017-02-23 DIAGNOSIS — E7800 Pure hypercholesterolemia, unspecified: Secondary | ICD-10-CM | POA: Diagnosis not present

## 2017-02-26 DIAGNOSIS — Z23 Encounter for immunization: Secondary | ICD-10-CM | POA: Diagnosis not present

## 2017-03-19 ENCOUNTER — Other Ambulatory Visit: Payer: Self-pay | Admitting: Infectious Diseases

## 2017-03-19 DIAGNOSIS — Z1231 Encounter for screening mammogram for malignant neoplasm of breast: Secondary | ICD-10-CM

## 2017-03-21 DIAGNOSIS — Z780 Asymptomatic menopausal state: Secondary | ICD-10-CM | POA: Diagnosis not present

## 2017-03-21 DIAGNOSIS — M8588 Other specified disorders of bone density and structure, other site: Secondary | ICD-10-CM | POA: Diagnosis not present

## 2017-04-10 ENCOUNTER — Ambulatory Visit
Admission: RE | Admit: 2017-04-10 | Discharge: 2017-04-10 | Disposition: A | Discharge status: Home / Self Care | Payer: Medicare Other | Source: Ambulatory Visit | Attending: Infectious Diseases | Admitting: Infectious Diseases

## 2017-04-10 DIAGNOSIS — Z1231 Encounter for screening mammogram for malignant neoplasm of breast: Secondary | ICD-10-CM | POA: Diagnosis not present

## 2017-05-01 DIAGNOSIS — H2513 Age-related nuclear cataract, bilateral: Secondary | ICD-10-CM | POA: Diagnosis not present

## 2017-05-17 ENCOUNTER — Encounter: Payer: Self-pay | Admitting: Psychiatry

## 2017-05-17 ENCOUNTER — Ambulatory Visit (INDEPENDENT_AMBULATORY_CARE_PROVIDER_SITE_OTHER): Payer: Medicare Other | Admitting: Psychiatry

## 2017-05-17 VITALS — BP 151/83 | HR 73 | Temp 98.3°F | Wt 144.0 lb

## 2017-05-17 DIAGNOSIS — F429 Obsessive-compulsive disorder, unspecified: Secondary | ICD-10-CM | POA: Diagnosis not present

## 2017-05-17 DIAGNOSIS — F411 Generalized anxiety disorder: Secondary | ICD-10-CM | POA: Diagnosis not present

## 2017-05-17 MED ORDER — SERTRALINE HCL 50 MG PO TABS: 50.0000 mg | ORAL_TABLET | Freq: Every day | ORAL | 2 refills | Status: DC

## 2017-05-17 MED ORDER — RISPERIDONE 0.25 MG PO TABS: 0.2500 mg | ORAL_TABLET | Freq: Two times a day (BID) | ORAL | 1 refills | Status: DC

## 2017-05-17 NOTE — Progress Notes (Signed)
Patient ID: Vanessa Vaughn, female   DOB: 28-Oct-1940, 76 y.o.   MRN: [id number]  [facility] MD/PA/NP OP Progress Note  05/17/2017 10:50 AM Vanessa Vaughn  MRN:  [id number]  Subjective:  Patient returns for follow-up of her obsessive-compulsive disorder. She reports that she has been doing okay in terms of her mood but her twitches and tics have gotten worse. Patient broken drinks a small self-made year that she took at home when she states that she was not trying to control her tics. There are several twitches of her lips and also around her nose and her eyes. States that this has been particularly bothersome. Denies any other problems with her mood. Fair sleep and appetite. Continues to enjoy her church activities. States that her son continues to have his issues with not settling down but states she is more concerned about herself.   Chief Complaint: Doing well Chief Complaint    Follow-up; Medication Refill     Visit Diagnosis:     ICD-10-CM   1. GAD (generalized anxiety disorder) F41.1   2. Obsessive-compulsive disorder, unspecified type F42.9     Past Medical History:  Past Medical History:  Diagnosis Date  . Anxiety   . Chronic back pain    stenosis  . Constipation   . Hx of migraines    5+yrs ago;pt states caused by Lipitor  . Hyperlipidemia    takes Simvastatin daily  . Hypothyroidism    takes Synthroid daily  . Insomnia    takes Ambien prn  . Muscle spasm    takes Robaxin prn  . PONV (postoperative nausea and vomiting)   . Urinary frequency    pt states related to meds    Past Surgical History:  Procedure Laterality Date  . COLONOSCOPY    . LUMBAR LAMINECTOMY/DECOMPRESSION MICRODISCECTOMY  03/19/2012   Procedure: LUMBAR LAMINECTOMY/DECOMPRESSION MICRODISCECTOMY 2 LEVELS;  Surgeon: Charlie Pitter, MD;  Location: [place] [facility];  Service: Neurosurgery;  Laterality: Bilateral;  Bilateral Lumbar Two-Four Decompressive Laminectomy  . THYROIDECTOMY, PARTIAL    . TUBAL LIGATION      Family History:  Family History  Problem Relation Age of Onset  . Breast cancer Sister 49  . Anxiety disorder Mother   . Heart disease Father   . Angina Father   . Heart failure Father   . Alcohol abuse Brother   . Anesthesia problems Neg Hx   . Hypotension Neg Hx   . Malignant hyperthermia Neg Hx   . Pseudochol deficiency Neg Hx    Social History:  Social History   Social History  . Marital status: Divorced    Spouse name: N/A  . Number of children: N/A  . Years of education: N/A   Social History Main Topics  . Smoking status: Former Smoker    Start date: 09/29/1959    Quit date: 10/16/1984  . Smokeless tobacco: Never Used     Comment: quit in 1986  . Alcohol use 0.6 oz/week    1 Glasses of wine per week     Comment: occasionally wine  . Drug use: No  . Sexual activity: Not Currently    Birth control/ protection: Post-menopausal   Other Topics Concern  . None   Social History Narrative  . None   Additional History:   Assessment:   Musculoskeletal: Strength & Muscle Tone: within normal limits Gait & Station: normal Patient leans: N/A  Psychiatric Specialty Exam: Medication Refill     ROS  There were no  vitals taken for this visit.There is no height or weight on file to calculate BMI.  General Appearance: Well Groomed  Eye Contact:  Good  Speech:  Normal Rate  Volume:  Normal  Mood:  Good  Affect:  wnl  Thought Process:  Linear and Logical  Orientation:  Full (Time, Place, and Person)  Thought Content:  Negative  Suicidal Thoughts:  No  Homicidal Thoughts:  No  Memory:  Immediate;   Good Recent;   Good Remote;   Good  Judgement:  Good  Insight:  Good  Psychomotor Activity:  Negative  Concentration:  Good  Recall:  Good  Fund of Knowledge: Good  Language: Good  Akathisia:  Negative  Handed:    AIMS (if indicated):    Assets:  Communication Skills Desire for Improvement  ADL's:  Intact  Cognition: WNL  Sleep:  good   Is the patient  at risk to self?  No. Has the patient been a risk to self in the past 6 months?  No. Has the patient been a risk to self within the distant past?  No. Is the patient a risk to others?  No. Has the patient been a risk to others in the past 6 months?  No. Has the patient been a risk to others within the distant past?  No.  Current Medications: Current Outpatient Prescriptions  Medication Sig Dispense Refill  . levothyroxine (SYNTHROID, LEVOTHROID) 88 MCG tablet Take 88 mcg by mouth daily.    . meloxicam (MOBIC) 7.5 MG tablet Take 7.5 mg by mouth daily.    . sertraline (ZOLOFT) 50 MG tablet Take 1 tablet (50 mg total) by mouth daily. 90 tablet 2  . simvastatin (ZOCOR) 20 MG tablet Take 20 mg by mouth every evening.     No current facility-administered medications for this visit.     Medical Decision Making:  Established Problem, Stable/Improving (1) and Review of Medication Regimen & Side Effects (2)  Treatment Plan Summary:Medication management and Plan Plan  Obsessive-compulsive disorder- Continue sertraline at 50mg  po qd to address obsessive symptoms.  Tic disorder- start Risperdal at 0.25 mg twice daily. We discussed the side effects of sedation, weight gain, metabolic side effects and long-term movement disorders. Patient gave her permission today. Also discussed that she should take the morning dose at home and see if she is sedated before she attempts to drive in the mornings.  Insomnia- resolved  She will follow-up in 1 months. She's been encouraged call any questions or concerns prior to her next appointment.  Vanessa Vaughn 05/17/2017, 10:50 AM

## 2017-06-19 ENCOUNTER — Ambulatory Visit: Payer: Medicare Other | Admitting: Psychiatry

## 2017-06-19 ENCOUNTER — Encounter: Payer: Self-pay | Admitting: Psychiatry

## 2017-06-19 VITALS — BP 158/82 | HR 73 | Temp 97.8°F | Wt 144.8 lb

## 2017-06-19 DIAGNOSIS — F411 Generalized anxiety disorder: Secondary | ICD-10-CM

## 2017-06-19 DIAGNOSIS — F429 Obsessive-compulsive disorder, unspecified: Secondary | ICD-10-CM

## 2017-06-19 NOTE — Progress Notes (Signed)
Patient ID: Vanessa Vaughn, female   DOB: Aug 03, 1941, 76 y.o.   MRN: [id number]  [facility] MD/PA/NP OP Progress Note  06/19/2017 9:41 AM FAVIOLA KLARE  MRN:  [id number]  Subjective:  Patient returns for follow-up of her obsessive-compulsive disorder. She reports that she was unable to tolerate the risperdal. It made her sleepy. She also cut the zoloft to 25mg  thinking it would help with her tics. She has also changed her glasses. Feels it has helped. Fair sleep and sleep. Denies any suicidal thoughts.   Chief Complaint: Doing okay. Chief Complaint    Follow-up; Medication Refill     Visit Diagnosis:     ICD-10-CM   1. GAD (generalized anxiety disorder) F41.1   2. Obsessive-compulsive disorder, unspecified type F42.9     Past Medical History:  Past Medical History:  Diagnosis Date  . Anxiety   . Chronic back pain    stenosis  . Constipation   . Hx of migraines    5+yrs ago;pt states caused by Lipitor  . Hyperlipidemia    takes Simvastatin daily  . Hypothyroidism    takes Synthroid daily  . Insomnia    takes Ambien prn  . Muscle spasm    takes Robaxin prn  . PONV (postoperative nausea and vomiting)   . Urinary frequency    pt states related to meds    Past Surgical History:  Procedure Laterality Date  . COLONOSCOPY    . LUMBAR LAMINECTOMY/DECOMPRESSION MICRODISCECTOMY  03/19/2012   Procedure: LUMBAR LAMINECTOMY/DECOMPRESSION MICRODISCECTOMY 2 LEVELS;  Surgeon: Charlie Pitter, MD;  Location: [place] [facility];  Service: Neurosurgery;  Laterality: Bilateral;  Bilateral Lumbar Two-Four Decompressive Laminectomy  . THYROIDECTOMY, PARTIAL    . TUBAL LIGATION     Family History:  Family History  Problem Relation Age of Onset  . Breast cancer Sister 57  . Anxiety disorder Mother   . Heart disease Father   . Angina Father   . Heart failure Father   . Alcohol abuse Brother   . Anesthesia problems Neg Hx   . Hypotension Neg Hx   . Malignant hyperthermia Neg Hx   . Pseudochol  deficiency Neg Hx    Social History:  Social History   Social History  . Marital status: Divorced    Spouse name: N/A  . Number of children: N/A  . Years of education: N/A   Social History Main Topics  . Smoking status: Former Smoker    Start date: 09/29/1959    Quit date: 10/16/1984  . Smokeless tobacco: Never Used     Comment: quit in 1986  . Alcohol use 0.6 oz/week    1 Glasses of wine per week     Comment: occasionally wine  . Drug use: No  . Sexual activity: Not Currently    Birth control/ protection: Post-menopausal   Other Topics Concern  . None   Social History Narrative  . None   Additional History:   Assessment:   Musculoskeletal: Strength & Muscle Tone: within normal limits Gait & Station: normal Patient leans: N/A  Psychiatric Specialty Exam: Medication Refill     ROS  Blood pressure (!) 158/82, pulse 73, temperature 97.8 F (36.6 C), temperature source Oral, weight 144 lb 12.8 oz (65.7 kg).Body mass index is 24.66 kg/m.  General Appearance: Well Groomed  Eye Contact:  Good  Speech:  Normal Rate  Volume:  Normal  Mood:  Good  Affect:  wnl  Thought Process:  Linear and Logical  Orientation:  Full (Time, Place, and Person)  Thought Content:  Negative  Suicidal Thoughts:  No  Homicidal Thoughts:  No  Memory:  Immediate;   Good Recent;   Good Remote;   Good  Judgement:  Good  Insight:  Good  Psychomotor Activity:  Negative  Concentration:  Good  Recall:  Good  Fund of Knowledge: Good  Language: Good  Akathisia:  Negative  Handed:    AIMS (if indicated):    Assets:  Communication Skills Desire for Improvement  ADL's:  Intact  Cognition: WNL  Sleep:  good   Is the patient at risk to self?  No. Has the patient been a risk to self in the past 6 months?  No. Has the patient been a risk to self within the distant past?  No. Is the patient a risk to others?  No. Has the patient been a risk to others in the past 6 months?  No. Has the  patient been a risk to others within the distant past?  No.  Current Medications: Current Outpatient Prescriptions  Medication Sig Dispense Refill  . levothyroxine (SYNTHROID, LEVOTHROID) 88 MCG tablet Take 88 mcg by mouth daily.    . meloxicam (MOBIC) 7.5 MG tablet Take 7.5 mg by mouth daily.    . risperiDONE (RISPERDAL) 0.25 MG tablet Take 1 tablet (0.25 mg total) by mouth 2 (two) times daily. 60 tablet 1  . sertraline (ZOLOFT) 50 MG tablet Take 1 tablet (50 mg total) by mouth daily. 90 tablet 2  . simvastatin (ZOCOR) 20 MG tablet Take 20 mg by mouth every evening.     No current facility-administered medications for this visit.     Medical Decision Making:  Established Problem, Stable/Improving (1) and Review of Medication Regimen & Side Effects (2)  Treatment Plan Summary:Medication management and Plan Plan  Obsessive-compulsive disorder- Continue sertraline at 25mg  po qd to address obsessive symptoms and can increase to 50mg  as needed.  Tic disorder- discontinue the risperdal  Insomnia- resolved  She will follow-up in 3 months. She's been encouraged call any questions or concerns prior to her next appointment.  Gerrod Maule 06/19/2017, 9:41 AM

## 2017-07-25 DIAGNOSIS — M4802 Spinal stenosis, cervical region: Secondary | ICD-10-CM | POA: Diagnosis not present

## 2017-07-25 DIAGNOSIS — M5412 Radiculopathy, cervical region: Secondary | ICD-10-CM | POA: Diagnosis not present

## 2017-07-25 DIAGNOSIS — M5030 Other cervical disc degeneration, unspecified cervical region: Secondary | ICD-10-CM | POA: Diagnosis not present

## 2017-08-13 ENCOUNTER — Other Ambulatory Visit: Payer: Self-pay | Admitting: Psychiatry

## 2017-08-24 DIAGNOSIS — E039 Hypothyroidism, unspecified: Secondary | ICD-10-CM | POA: Diagnosis not present

## 2017-08-24 DIAGNOSIS — E7800 Pure hypercholesterolemia, unspecified: Secondary | ICD-10-CM | POA: Diagnosis not present

## 2017-08-24 DIAGNOSIS — Z23 Encounter for immunization: Secondary | ICD-10-CM | POA: Diagnosis not present

## 2017-08-31 DIAGNOSIS — R030 Elevated blood-pressure reading, without diagnosis of hypertension: Secondary | ICD-10-CM | POA: Diagnosis not present

## 2017-08-31 DIAGNOSIS — E7800 Pure hypercholesterolemia, unspecified: Secondary | ICD-10-CM | POA: Diagnosis not present

## 2017-08-31 DIAGNOSIS — E039 Hypothyroidism, unspecified: Secondary | ICD-10-CM | POA: Diagnosis not present

## 2017-09-18 ENCOUNTER — Ambulatory Visit: Payer: Medicare Other | Admitting: Psychiatry

## 2017-10-29 ENCOUNTER — Encounter: Payer: Self-pay | Admitting: Psychiatry

## 2017-10-29 ENCOUNTER — Other Ambulatory Visit: Payer: Self-pay

## 2017-10-29 ENCOUNTER — Ambulatory Visit (INDEPENDENT_AMBULATORY_CARE_PROVIDER_SITE_OTHER): Payer: Medicare Other | Admitting: Psychiatry

## 2017-10-29 VITALS — BP 159/78 | HR 96 | Temp 98.4°F | Wt 150.2 lb

## 2017-10-29 DIAGNOSIS — F411 Generalized anxiety disorder: Secondary | ICD-10-CM

## 2017-10-29 DIAGNOSIS — F429 Obsessive-compulsive disorder, unspecified: Secondary | ICD-10-CM | POA: Diagnosis not present

## 2017-10-29 NOTE — Progress Notes (Signed)
Patient ID: Vanessa Vaughn, female   DOB: August 24, 1941, 77 y.o.   MRN: [id number]  [facility] MD/PA/NP OP Progress Note  10/29/2017 11:10 AM Vanessa Vaughn  MRN:  [id number]  Subjective:  Patient returns for follow-up of her obsessive-compulsive disorder. She reports doing well overall. She enjoyed her holidays. States she has told her son, she will not be giving him any more money . She feels she has spoiled him and no w that he is 56, shew ants to be firm and have boundaries. Fair sleep and sleep. Denies any suicidal thoughts.   Chief Complaint: Doing well Chief Complaint    Follow-up; Medication Refill     Visit Diagnosis:     ICD-10-CM   1. GAD (generalized anxiety disorder) F41.1   2. Obsessive-compulsive disorder, unspecified type F42.9     Past Medical History:  Past Medical History:  Diagnosis Date  . Anxiety   . Chronic back pain    stenosis  . Constipation   . Hx of migraines    5+yrs ago;pt states caused by Lipitor  . Hyperlipidemia    takes Simvastatin daily  . Hypothyroidism    takes Synthroid daily  . Insomnia    takes Ambien prn  . Muscle spasm    takes Robaxin prn  . PONV (postoperative nausea and vomiting)   . Urinary frequency    pt states related to meds    Past Surgical History:  Procedure Laterality Date  . COLONOSCOPY    . LUMBAR LAMINECTOMY/DECOMPRESSION MICRODISCECTOMY  03/19/2012   Procedure: LUMBAR LAMINECTOMY/DECOMPRESSION MICRODISCECTOMY 2 LEVELS;  Surgeon: Charlie Pitter, MD;  Location: [place] [facility];  Service: Neurosurgery;  Laterality: Bilateral;  Bilateral Lumbar Two-Four Decompressive Laminectomy  . THYROIDECTOMY, PARTIAL    . TUBAL LIGATION     Family History:  Family History  Problem Relation Age of Onset  . Breast cancer Sister 16  . Anxiety disorder Mother   . Heart disease Father   . Angina Father   . Heart failure Father   . Alcohol abuse Brother   . Anesthesia problems Neg Hx   . Hypotension Neg Hx   . Malignant hyperthermia Neg  Hx   . Pseudochol deficiency Neg Hx    Social History:  Social History   Socioeconomic History  . Marital status: Divorced    Spouse name: None  . Number of children: None  . Years of education: None  . Highest education level: None  Social Needs  . Financial resource strain: None  . Food insecurity - worry: None  . Food insecurity - inability: None  . Transportation needs - medical: None  . Transportation needs - non-medical: None  Occupational History  . None  Tobacco Use  . Smoking status: Former Smoker    Start date: 09/29/1959    Last attempt to quit: 10/16/1984    Years since quitting: 33.0  . Smokeless tobacco: Never Used  . Tobacco comment: quit in 1986  Substance and Sexual Activity  . Alcohol use: Yes    Alcohol/week: 0.6 oz    Types: 1 Glasses of wine per week    Comment: occasionally wine  . Drug use: No  . Sexual activity: Not Currently    Birth control/protection: Post-menopausal  Other Topics Concern  . None  Social History Narrative  . None   Additional History:   Assessment:   Musculoskeletal: Strength & Muscle Tone: within normal limits Gait & Station: normal Patient leans: N/A  Psychiatric Specialty Exam: Medication  Refill     ROS  Blood pressure (!) 159/78, pulse 96, temperature 98.4 F (36.9 C), temperature source Oral, weight 68.1 kg (150 lb 3.2 oz).Body mass index is 25.58 kg/m.  General Appearance: Well Groomed  Eye Contact:  Good  Speech:  Normal Rate  Volume:  Normal  Mood:  Good  Affect:  wnl  Thought Process:  Linear and Logical  Orientation:  Full (Time, Place, and Person)  Thought Content:  Negative  Suicidal Thoughts:  No  Homicidal Thoughts:  No  Memory:  Immediate;   Good Recent;   Good Remote;   Good  Judgement:  Good  Insight:  Good  Psychomotor Activity:  Negative  Concentration:  Good  Recall:  Good  Fund of Knowledge: Good  Language: Good  Akathisia:  Negative  Handed:    AIMS (if indicated):     Assets:  Communication Skills Desire for Improvement  ADL's:  Intact  Cognition: WNL  Sleep:  good   Is the patient at risk to self?  No. Has the patient been a risk to self in the past 6 months?  No. Has the patient been a risk to self within the distant past?  No. Is the patient a risk to others?  No. Has the patient been a risk to others in the past 6 months?  No. Has the patient been a risk to others within the distant past?  No.  Current Medications: Current Outpatient Medications  Medication Sig Dispense Refill  . levothyroxine (SYNTHROID, LEVOTHROID) 88 MCG tablet Take 88 mcg by mouth daily.    . meloxicam (MOBIC) 7.5 MG tablet Take 7.5 mg by mouth daily.    . sertraline (ZOLOFT) 50 MG tablet TAKE 1 TABLET EVERY DAY 90 tablet 2  . simvastatin (ZOCOR) 20 MG tablet Take 20 mg by mouth every evening.     No current facility-administered medications for this visit.     Medical Decision Making:  Established Problem, Stable/Improving (1) and Review of Medication Regimen & Side Effects (2)  Treatment Plan Summary:Medication management and Plan Plan  Obsessive-compulsive disorder-  Continue zoloft at 50mg  po qd.  Tic disorder- stable  Insomnia- resolved  She will follow-up in 6 months. She's been encouraged call any questions or concerns prior to her next appointment.  Vanessa Vaughn 10/29/2017, 11:10 AM

## 2018-02-13 DIAGNOSIS — D225 Melanocytic nevi of trunk: Secondary | ICD-10-CM | POA: Diagnosis not present

## 2018-02-13 DIAGNOSIS — L821 Other seborrheic keratosis: Secondary | ICD-10-CM | POA: Diagnosis not present

## 2018-02-13 DIAGNOSIS — D2271 Melanocytic nevi of right lower limb, including hip: Secondary | ICD-10-CM | POA: Diagnosis not present

## 2018-02-13 DIAGNOSIS — D2262 Melanocytic nevi of left upper limb, including shoulder: Secondary | ICD-10-CM | POA: Diagnosis not present

## 2018-02-13 DIAGNOSIS — D2261 Melanocytic nevi of right upper limb, including shoulder: Secondary | ICD-10-CM | POA: Diagnosis not present

## 2018-02-13 DIAGNOSIS — D2272 Melanocytic nevi of left lower limb, including hip: Secondary | ICD-10-CM | POA: Diagnosis not present

## 2018-02-22 DIAGNOSIS — E559 Vitamin D deficiency, unspecified: Secondary | ICD-10-CM | POA: Diagnosis not present

## 2018-02-22 DIAGNOSIS — E7800 Pure hypercholesterolemia, unspecified: Secondary | ICD-10-CM | POA: Diagnosis not present

## 2018-02-22 DIAGNOSIS — E039 Hypothyroidism, unspecified: Secondary | ICD-10-CM | POA: Diagnosis not present

## 2018-02-22 DIAGNOSIS — R030 Elevated blood-pressure reading, without diagnosis of hypertension: Secondary | ICD-10-CM | POA: Diagnosis not present

## 2018-03-01 DIAGNOSIS — E7800 Pure hypercholesterolemia, unspecified: Secondary | ICD-10-CM | POA: Diagnosis not present

## 2018-03-01 DIAGNOSIS — E039 Hypothyroidism, unspecified: Secondary | ICD-10-CM | POA: Diagnosis not present

## 2018-03-01 DIAGNOSIS — R030 Elevated blood-pressure reading, without diagnosis of hypertension: Secondary | ICD-10-CM | POA: Diagnosis not present

## 2018-03-01 DIAGNOSIS — E559 Vitamin D deficiency, unspecified: Secondary | ICD-10-CM | POA: Diagnosis not present

## 2018-03-01 DIAGNOSIS — Z0000 Encounter for general adult medical examination without abnormal findings: Secondary | ICD-10-CM | POA: Diagnosis not present

## 2018-03-15 ENCOUNTER — Other Ambulatory Visit: Payer: Self-pay | Admitting: Infectious Diseases

## 2018-03-15 DIAGNOSIS — Z1231 Encounter for screening mammogram for malignant neoplasm of breast: Secondary | ICD-10-CM

## 2018-04-11 ENCOUNTER — Ambulatory Visit
Admission: RE | Admit: 2018-04-11 | Discharge: 2018-04-11 | Disposition: A | Discharge status: Home / Self Care | Payer: Medicare Other | Source: Ambulatory Visit | Attending: Infectious Diseases | Admitting: Infectious Diseases

## 2018-04-11 DIAGNOSIS — Z1231 Encounter for screening mammogram for malignant neoplasm of breast: Secondary | ICD-10-CM | POA: Diagnosis not present

## 2018-04-25 ENCOUNTER — Ambulatory Visit: Payer: Medicare Other | Admitting: Psychiatry

## 2018-04-28 IMAGING — MR MR CERVICAL SPINE W/O CM
5 series · 33 of 48 positions shown · non-contrast
Comparison: MRI cervical spine 02/03/2008.

CLINICAL DATA: Neck pain which began 1 year ago while using a
pressure washer. No known injury. The patient denies extremity
symptoms.

EXAM:
MRI CERVICAL SPINE WITHOUT CONTRAST
TECHNIQUE: Multiplanar, multisequence MR imaging of the cervical spine was
performed. No intravenous contrast was administered.

[Series 2: T2 · sagittal · 3.0mm · 0.56mm/px · 8 of 13 slices shown (1 of 2)]
[im 1/13]
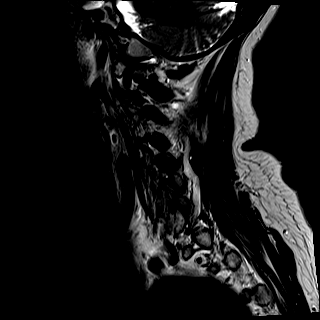
[im 2/13]
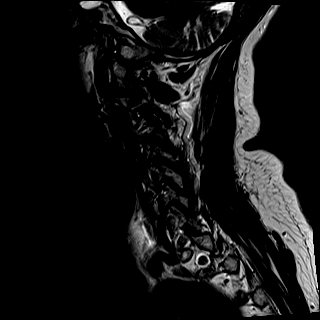
[im 4/13]
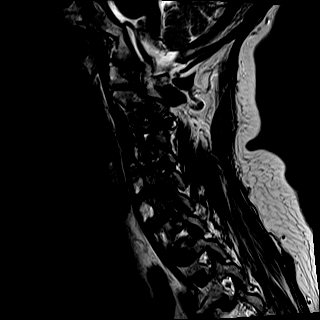
[im 6/13]
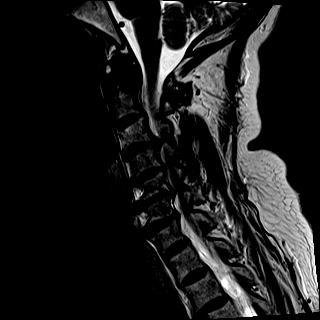
[im 7/13]
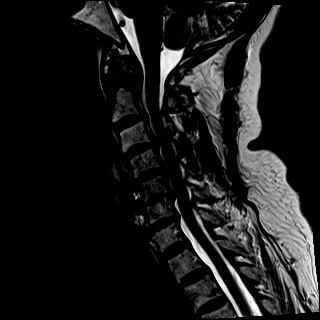
[im 9/13]
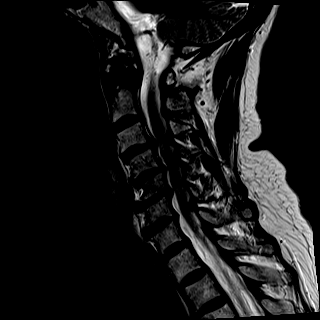
[im 11/13]
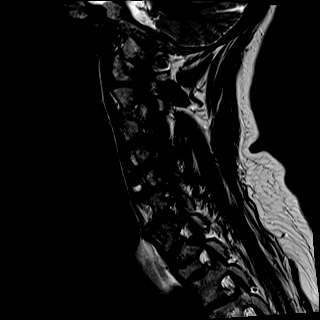
[im 13/13]
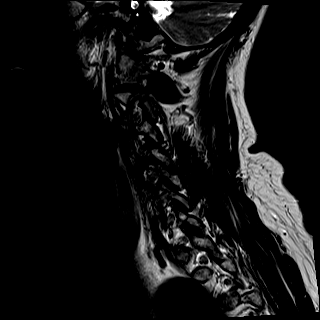

[Series 3: T1 · sagittal · 3.0mm · 0.70mm/px · 7 of 13 slices shown]
[im 1/13]
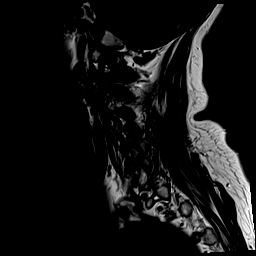
[im 3/13]
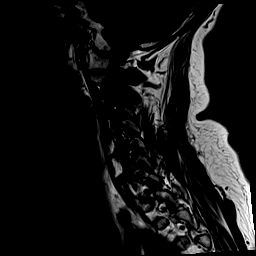
[im 5/13]
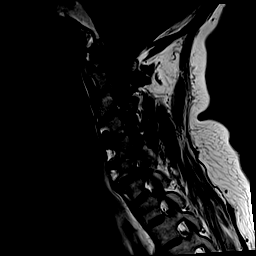
[im 7/13]
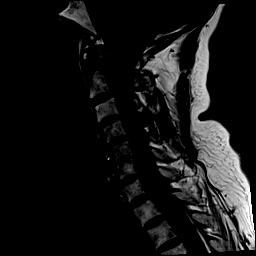
[im 9/13]
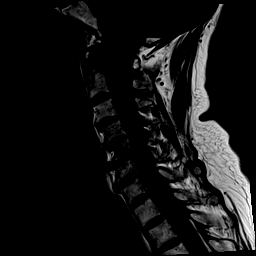
[im 11/13]
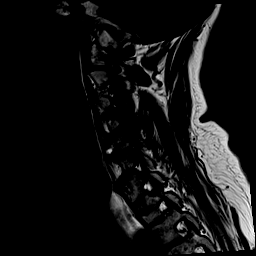
[im 13/13]
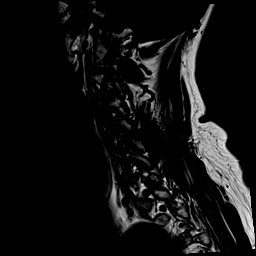

[Series 4: STIR · sagittal · 3.0mm · 0.35mm/px · 7 of 13 slices shown]
[im 1/13]
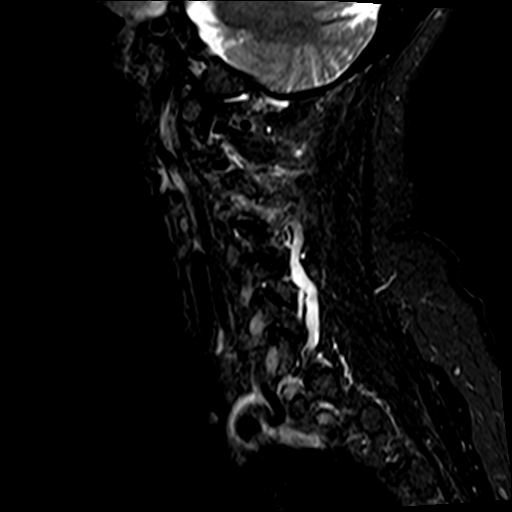
[im 3/13]
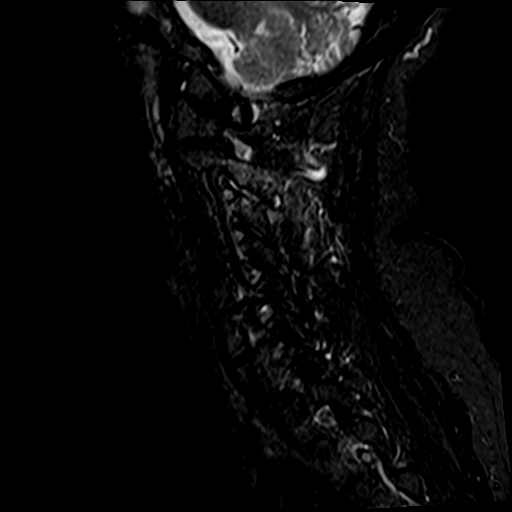
[im 5/13]
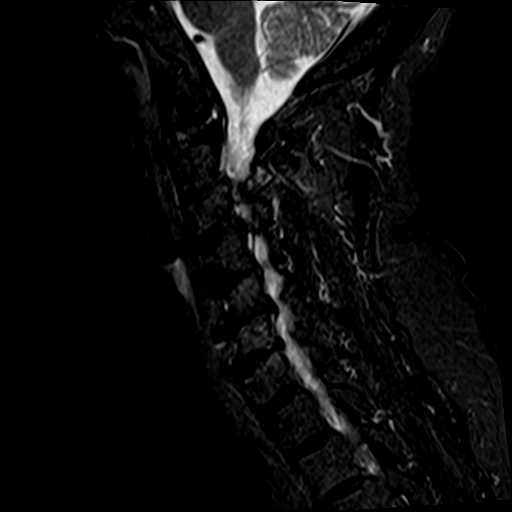
[im 7/13]
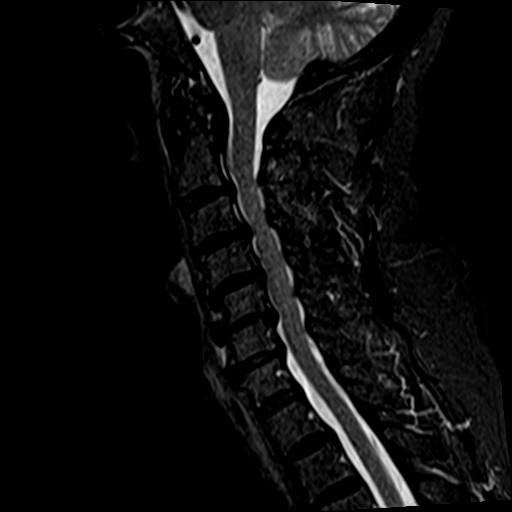
[im 9/13]
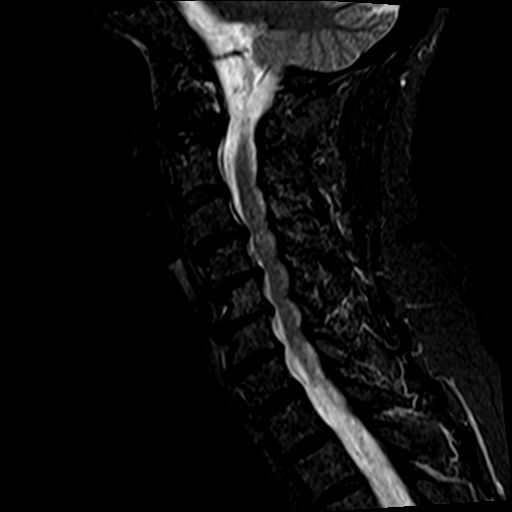
[im 11/13]
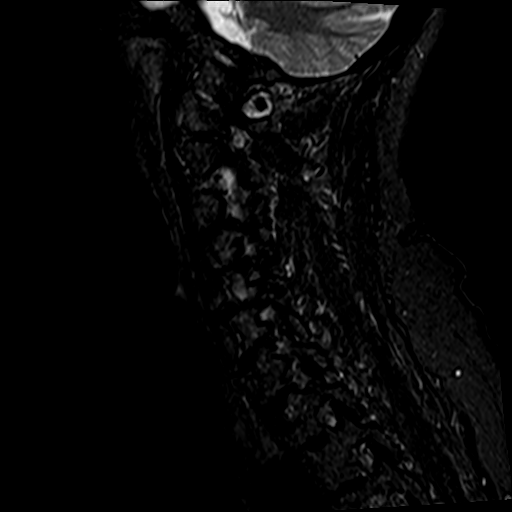
[im 13/13]
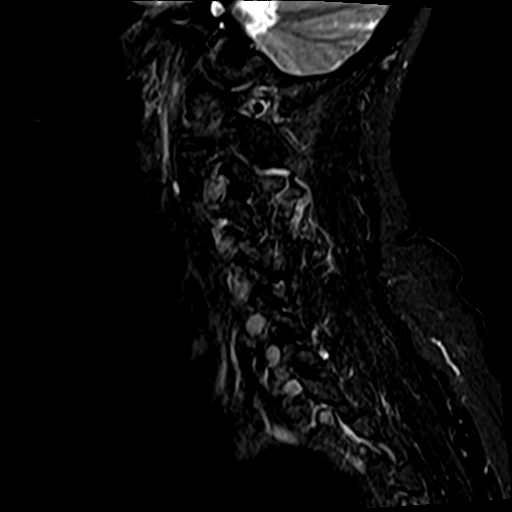

[Series 5: T2 · axial · 3.0mm · 0.62mm/px · z∈[-88,-3]mm · 9 of 25 slices shown (2 of 2)]
[im 1/25]
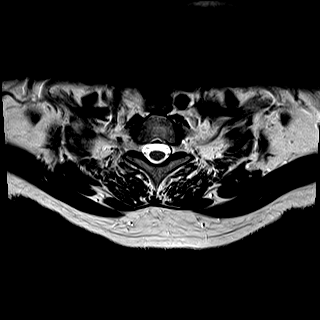
[im 5/25]
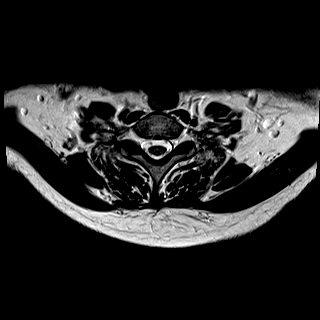
[im 9/25]
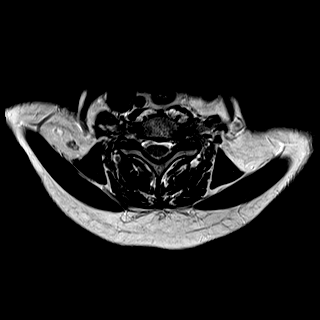
[im 11/25]
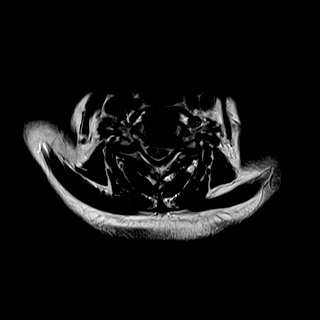
[im 13/25]
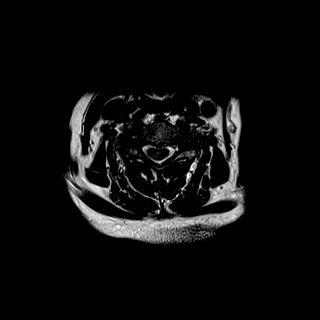
[im 15/25]
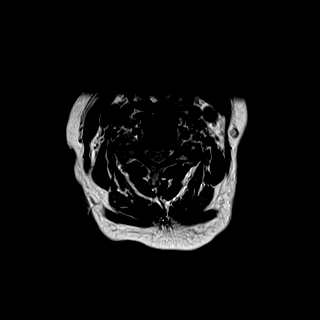
[im 17/25]
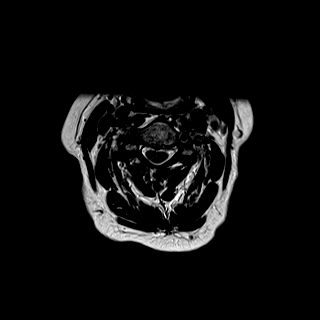
[im 21/25]
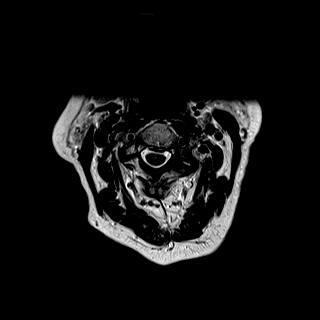
[im 25/25]
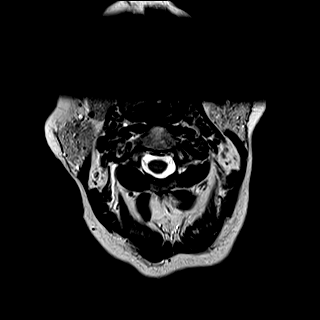

[Series 6: mpgr ax · axial · 3.0mm · 0.35mm/px · z∈[-74,-60]mm · 2 of 25 slices shown]
[im 1/25]
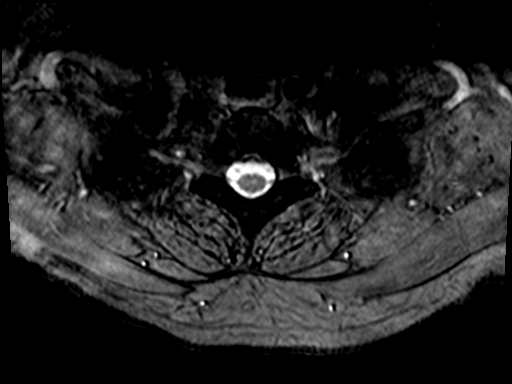
[im 5/25]
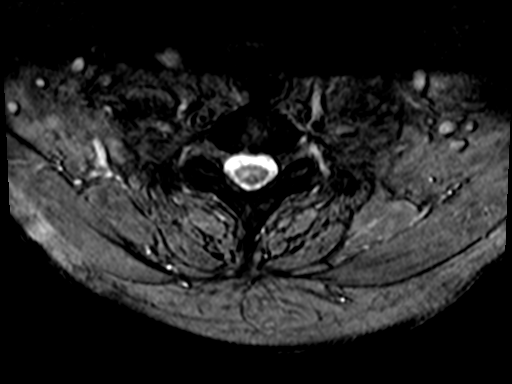

[33 of 48 positions shown; findings below may reference images not displayed]

FINDINGS: Alignment: There is mild reversal of the normal cervical lordosis
with associated 0.2 cm anterolisthesis C3 on C4, trace C4 on C5 and
0.2 cm retrolisthesis C5 on C6.

Vertebrae: No fracture or worrisome lesion.

Cord: Normal signal throughout.

Posterior Fossa, vertebral arteries, paraspinal tissues: Negative.

Disc levels:

C2-3: Central and left paracentral disc protrusion is more prominent
on the prior exam. Facet degenerative change on the left has
worsened and there is marrow edema about the facet joint. Disc
effaces the ventral thecal sac but the central canal is open. Severe
left foraminal narrowing is identified. The right foramen is open.

C3-4: Facet degenerative disease, disc osteophyte complex and
uncovertebral disease are seen. There is flattening of the ventral
cord. Severe bilateral foraminal narrowing is identified. The
appearance is not grossly changed.

C4-5: Central disc protrusion indenting the ventral cord and right
worse than left uncovertebral disease are seen. There is facet
arthropathy. Severe bilateral foraminal narrowing is identified. The
appearance is not markedly changed.

C5-6: Disc osteophyte complex and left uncovertebral disease are
seen. There is mild flattening of the ventral cord. Moderate to
moderately severe foraminal narrowing is worse on the left. The
appearance is not grossly changed.

C6-7: Disc bulge and left worse than right uncovertebral disease.
The ventral thecal sac is narrowed but not effaced. Severe left
foraminal narrowing is identified. The right foramen is open.

C7-T1: There is a shallow disc bulge and some facet degenerative
disease. The central canal and foramina are open.
IMPRESSION: Progressive facet degenerative disease on the left at C2-3 where
there is marrow edema about the joint. This could be a source of
left side neck pain. Facet arthropathy and uncovertebral spurring at
this level cause severe left foraminal narrowing. Broad-based
central protrusion effaces the ventral thecal sac.

No change in marked degenerative disc disease C3-4, C4-5 and C5-6 as
described above. Milder degree of spondylosis at C6-7 is also
unchanged.

## 2018-05-10 DIAGNOSIS — H43393 Other vitreous opacities, bilateral: Secondary | ICD-10-CM | POA: Diagnosis not present

## 2018-05-10 DIAGNOSIS — H31091 Other chorioretinal scars, right eye: Secondary | ICD-10-CM | POA: Diagnosis not present

## 2018-05-10 DIAGNOSIS — H524 Presbyopia: Secondary | ICD-10-CM | POA: Diagnosis not present

## 2018-05-10 DIAGNOSIS — H2513 Age-related nuclear cataract, bilateral: Secondary | ICD-10-CM | POA: Diagnosis not present

## 2018-05-16 ENCOUNTER — Ambulatory Visit (INDEPENDENT_AMBULATORY_CARE_PROVIDER_SITE_OTHER): Payer: Medicare Other | Admitting: Psychiatry

## 2018-05-16 ENCOUNTER — Encounter: Payer: Self-pay | Admitting: Psychiatry

## 2018-05-16 VITALS — BP 162/77 | HR 72 | Ht 64.25 in | Wt 146.0 lb

## 2018-05-16 DIAGNOSIS — F411 Generalized anxiety disorder: Secondary | ICD-10-CM | POA: Diagnosis not present

## 2018-05-16 DIAGNOSIS — F429 Obsessive-compulsive disorder, unspecified: Secondary | ICD-10-CM

## 2018-05-16 MED ORDER — SERTRALINE HCL 50 MG PO TABS: ORAL_TABLET | ORAL | 2 refills | Status: AC

## 2018-05-16 NOTE — Progress Notes (Signed)
Patient ID: Vanessa Vaughn, female   DOB: 04-17-1941, 77 y.o.   MRN: [id number]  [facility] MD/PA/NP OP Progress Note  05/16/2018 9:30 AM Vanessa Vaughn  MRN:  [id number]  Subjective:  Patient returns for follow-up of her obsessive-compulsive disorder. She reports her twitches are getting worse and bothersome. She is wanting the tics to stop since they interfere with her social activities.  She has good friends and enjoys spending time with them.  Enjoying her church activities. Denies any suicidal thoughts.   Chief Complaint: increased tics  Visit Diagnosis:     ICD-10-CM   1. GAD (generalized anxiety disorder) F41.1   2. Obsessive-compulsive disorder, unspecified type F42.9     Past Medical History:  Past Medical History:  Diagnosis Date  . Anxiety   . Chronic back pain    stenosis  . Constipation   . Hx of migraines    5+yrs ago;pt states caused by Lipitor  . Hyperlipidemia    takes Simvastatin daily  . Hypothyroidism    takes Synthroid daily  . Insomnia    takes Ambien prn  . Muscle spasm    takes Robaxin prn  . PONV (postoperative nausea and vomiting)   . Urinary frequency    pt states related to meds    Past Surgical History:  Procedure Laterality Date  . COLONOSCOPY    . LUMBAR LAMINECTOMY/DECOMPRESSION MICRODISCECTOMY  03/19/2012   Procedure: LUMBAR LAMINECTOMY/DECOMPRESSION MICRODISCECTOMY 2 LEVELS;  Surgeon: Charlie Pitter, MD;  Location: [place] [facility];  Service: Neurosurgery;  Laterality: Bilateral;  Bilateral Lumbar Two-Four Decompressive Laminectomy  . THYROIDECTOMY, PARTIAL    . TUBAL LIGATION     Family History:  Family History  Problem Relation Age of Onset  . Breast cancer Sister 11  . Anxiety disorder Mother   . Heart disease Father   . Angina Father   . Heart failure Father   . Alcohol abuse Brother   . Anesthesia problems Neg Hx   . Hypotension Neg Hx   . Malignant hyperthermia Neg Hx   . Pseudochol deficiency Neg Hx    Social History:  Social  History   Socioeconomic History  . Marital status: Divorced    Spouse name: Not on file  . Number of children: Not on file  . Years of education: Not on file  . Highest education level: Not on file  Occupational History  . Not on file  Social Needs  . Financial resource strain: Not on file  . Food insecurity:    Worry: Not on file    Inability: Not on file  . Transportation needs:    Medical: Not on file    Non-medical: Not on file  Tobacco Use  . Smoking status: Former Smoker    Start date: 09/29/1959    Last attempt to quit: 10/16/1984    Years since quitting: 33.6  . Smokeless tobacco: Never Used  . Tobacco comment: quit in 1986  Substance and Sexual Activity  . Alcohol use: Yes    Alcohol/week: 0.6 oz    Types: 1 Glasses of wine per week    Comment: occasionally wine  . Drug use: No  . Sexual activity: Not Currently    Birth control/protection: Post-menopausal  Lifestyle  . Physical activity:    Days per week: Not on file    Minutes per session: Not on file  . Stress: Not on file  Relationships  . Social connections:    Talks on phone: Not on file  Gets together: Not on file    Attends religious service: Not on file    Active member of club or organization: Not on file    Attends meetings of clubs or organizations: Not on file    Relationship status: Not on file  Other Topics Concern  . Not on file  Social History Narrative  . Not on file   Additional History:   Assessment:   Musculoskeletal: Strength & Muscle Tone: within normal limits Gait & Station: normal Patient leans: N/A  Psychiatric Specialty Exam: Medication Refill     ROS  Blood pressure (!) 162/77, pulse 72, height 5' 4.25" (1.632 m), weight 66.2 kg (146 lb), SpO2 96 %.Body mass index is 24.87 kg/m.  General Appearance: Well Groomed  Eye Contact:  Good  Speech:  Normal Rate  Volume:  Normal  Mood:  Good  Affect:  wnl  Thought Process:  Linear and Logical  Orientation:  Full  (Time, Place, and Person)  Thought Content:  Negative  Suicidal Thoughts:  No  Homicidal Thoughts:  No  Memory:  Immediate;   Good Recent;   Good Remote;   Good  Judgement:  Good  Insight:  Good  Psychomotor Activity:  Negative  Concentration:  Good  Recall:  Good  Fund of Knowledge: Good  Language: Good  Akathisia:  Negative  Handed:    AIMS (if indicated):    Assets:  Communication Skills Desire for Improvement  ADL's:  Intact  Cognition: WNL  Sleep:  good   Is the patient at risk to self?  No. Has the patient been a risk to self in the past 6 months?  No. Has the patient been a risk to self within the distant past?  No. Is the patient a risk to others?  No. Has the patient been a risk to others in the past 6 months?  No. Has the patient been a risk to others within the distant past?  No.  Current Medications: Current Outpatient Medications  Medication Sig Dispense Refill  . Cholecalciferol (VITAMIN D3 PO) Take by mouth.    . levothyroxine (SYNTHROID, LEVOTHROID) 88 MCG tablet Take 88 mcg by mouth daily.    . meloxicam (MOBIC) 7.5 MG tablet Take 7.5 mg by mouth daily.    . sertraline (ZOLOFT) 50 MG tablet TAKE 1 TABLET EVERY DAY 90 tablet 2  . simvastatin (ZOCOR) 20 MG tablet Take 20 mg by mouth every evening.     No current facility-administered medications for this visit.     Medical Decision Making:  Established Problem, Stable/Improving (1) and Review of Medication Regimen & Side Effects (2)  Treatment Plan Summary:Medication management and Plan Plan  Obsessive-compulsive disorder-  Increase Zoloft to 25mg  in the morning and take the 50 mg at night.  Tic disorder- increased recently  Insomnia- resolved  She will follow-up in 6 weeks. She's been encouraged call any questions or concerns prior to her next appointment.  Vanessa Vaughn 05/16/2018, 9:30 AM

## 2018-07-02 ENCOUNTER — Ambulatory Visit (INDEPENDENT_AMBULATORY_CARE_PROVIDER_SITE_OTHER): Payer: Medicare Other | Admitting: Psychiatry

## 2018-07-02 ENCOUNTER — Encounter: Payer: Self-pay | Admitting: Psychiatry

## 2018-07-02 ENCOUNTER — Other Ambulatory Visit: Payer: Self-pay

## 2018-07-02 VITALS — BP 174/83 | HR 75 | Temp 98.0°F | Wt 145.8 lb

## 2018-07-02 DIAGNOSIS — F429 Obsessive-compulsive disorder, unspecified: Secondary | ICD-10-CM | POA: Diagnosis not present

## 2018-07-02 DIAGNOSIS — F411 Generalized anxiety disorder: Secondary | ICD-10-CM | POA: Diagnosis not present

## 2018-07-02 NOTE — Progress Notes (Signed)
Patient ID: Vanessa Vaughn, female   DOB: January 28, 1941, 77 y.o.   MRN: [id number]  [facility] MD/PA/NP OP Progress Note  07/02/2018 10:41 AM Vanessa Vaughn  MRN:  [id number]  Subjective:  Patient returns for follow-up of her obsessive-compulsive disorder. She reports her twitches have improved. Reports she is unware of what triggers and improves.  She has tolerated the increase in zoloft well, not sure if that helped with her tics but they are much better.  She has good friends and enjoys spending time with them.  Enjoying her church activities. Denies any suicidal thoughts.   Chief Complaint: doing well Chief Complaint    Follow-up; Medication Refill     Visit Diagnosis:     ICD-10-CM   1. GAD (generalized anxiety disorder) F41.1   2. Obsessive-compulsive disorder, unspecified type F42.9     Past Medical History:  Past Medical History:  Diagnosis Date  . Anxiety   . Chronic back pain    stenosis  . Constipation   . Hx of migraines    5+yrs ago;pt states caused by Lipitor  . Hyperlipidemia    takes Simvastatin daily  . Hypothyroidism    takes Synthroid daily  . Insomnia    takes Ambien prn  . Muscle spasm    takes Robaxin prn  . PONV (postoperative nausea and vomiting)   . Urinary frequency    pt states related to meds    Past Surgical History:  Procedure Laterality Date  . COLONOSCOPY    . LUMBAR LAMINECTOMY/DECOMPRESSION MICRODISCECTOMY  03/19/2012   Procedure: LUMBAR LAMINECTOMY/DECOMPRESSION MICRODISCECTOMY 2 LEVELS;  Surgeon: Charlie Pitter, MD;  Location: [facility];  Service: Neurosurgery;  Laterality: Bilateral;  Bilateral Lumbar Two-Four Decompressive Laminectomy  . THYROIDECTOMY, PARTIAL    . TUBAL LIGATION     Family History:  Family History  Problem Relation Age of Onset  . Breast cancer Sister 30  . Anxiety disorder Mother   . Heart disease Father   . Angina Father   . Heart failure Father   . Alcohol abuse Brother   . Anesthesia problems Neg Hx   .  Hypotension Neg Hx   . Malignant hyperthermia Neg Hx   . Pseudochol deficiency Neg Hx    Social History:  Social History   Socioeconomic History  . Marital status: Divorced    Spouse name: Not on file  . Number of children: Not on file  . Years of education: Not on file  . Highest education level: Not on file  Occupational History  . Not on file  Social Needs  . Financial resource strain: Not on file  . Food insecurity:    Worry: Not on file    Inability: Not on file  . Transportation needs:    Medical: Not on file    Non-medical: Not on file  Tobacco Use  . Smoking status: Former Smoker    Start date: 09/29/1959    Last attempt to quit: 10/16/1984    Years since quitting: 33.7  . Smokeless tobacco: Never Used  . Tobacco comment: quit in 1986  Substance and Sexual Activity  . Alcohol use: Yes    Alcohol/week: 1.0 standard drinks    Types: 1 Glasses of wine per week    Comment: occasionally wine  . Drug use: No  . Sexual activity: Not Currently    Birth control/protection: Post-menopausal  Lifestyle  . Physical activity:    Days per week: Not on file    Minutes  per session: Not on file  . Stress: Not on file  Relationships  . Social connections:    Talks on phone: Not on file    Gets together: Not on file    Attends religious service: Not on file    Active member of club or organization: Not on file    Attends meetings of clubs or organizations: Not on file    Relationship status: Not on file  Other Topics Concern  . Not on file  Social History Narrative  . Not on file   Additional History:   Assessment:   Musculoskeletal: Strength & Muscle Tone: within normal limits Gait & Station: normal Patient leans: N/A  Psychiatric Specialty Exam: Medication Refill     ROS  Blood pressure (!) 174/83, pulse 75, temperature 98 F (36.7 C), temperature source Oral, weight 66.1 kg.Body mass index is 24.83 kg/m.  General Appearance: Well Groomed  Eye Contact:   Good  Speech:  Normal Rate  Volume:  Normal  Mood:  Good  Affect:  wnl  Thought Process:  Linear and Logical  Orientation:  Full (Time, Place, and Person)  Thought Content:  Negative  Suicidal Thoughts:  No  Homicidal Thoughts:  No  Memory:  Immediate;   Good Recent;   Good Remote;   Good  Judgement:  Good  Insight:  Good  Psychomotor Activity:  Negative  Concentration:  Good  Recall:  Good  Fund of Knowledge: Good  Language: Good  Akathisia:  Negative  Handed:    AIMS (if indicated):    Assets:  Communication Skills Desire for Improvement  ADL's:  Intact  Cognition: WNL  Sleep:  good   Is the patient at risk to self?  No. Has the patient been a risk to self in the past 6 months?  No. Has the patient been a risk to self within the distant past?  No. Is the patient a risk to others?  No. Has the patient been a risk to others in the past 6 months?  No. Has the patient been a risk to others within the distant past?  No.  Current Medications: Current Outpatient Medications  Medication Sig Dispense Refill  . Cholecalciferol (VITAMIN D3 PO) Take by mouth.    . levothyroxine (SYNTHROID, LEVOTHROID) 88 MCG tablet Take 88 mcg by mouth daily.    . meloxicam (MOBIC) 7.5 MG tablet Take 7.5 mg by mouth daily.    . sertraline (ZOLOFT) 50 MG tablet Take half tablet (25mg ) in the morning by mouth and a full tablet(50mg ) in the evening by mouth. 45 tablet 2  . simvastatin (ZOCOR) 20 MG tablet Take 20 mg by mouth every evening.     No current facility-administered medications for this visit.     Medical Decision Making:  Established Problem, Stable/Improving (1) and Review of Medication Regimen & Side Effects (2)  Treatment Plan Summary:Medication management and Plan Plan  Obsessive-compulsive disorder-  Continue the zoloft at 75mg  in the morning. Tic disorder- improved  Insomnia- resolved  She will follow-up in 3 months.. She's been encouraged call any questions or concerns  prior to her next appointment.  Maxamillian Tienda 07/02/2018, 10:41 AM

## 2018-08-27 DIAGNOSIS — E7800 Pure hypercholesterolemia, unspecified: Secondary | ICD-10-CM | POA: Diagnosis not present

## 2018-08-27 DIAGNOSIS — R0989 Other specified symptoms and signs involving the circulatory and respiratory systems: Secondary | ICD-10-CM | POA: Diagnosis not present

## 2018-08-27 DIAGNOSIS — Z23 Encounter for immunization: Secondary | ICD-10-CM | POA: Diagnosis not present

## 2018-08-27 DIAGNOSIS — R011 Cardiac murmur, unspecified: Secondary | ICD-10-CM | POA: Diagnosis not present

## 2018-08-27 DIAGNOSIS — E559 Vitamin D deficiency, unspecified: Secondary | ICD-10-CM | POA: Diagnosis not present

## 2018-08-27 DIAGNOSIS — I10 Essential (primary) hypertension: Secondary | ICD-10-CM | POA: Diagnosis not present

## 2018-08-27 DIAGNOSIS — E039 Hypothyroidism, unspecified: Secondary | ICD-10-CM | POA: Diagnosis not present

## 2018-08-27 DIAGNOSIS — Z79899 Other long term (current) drug therapy: Secondary | ICD-10-CM | POA: Diagnosis not present

## 2018-09-10 DIAGNOSIS — I6523 Occlusion and stenosis of bilateral carotid arteries: Secondary | ICD-10-CM | POA: Diagnosis not present

## 2018-09-10 DIAGNOSIS — R011 Cardiac murmur, unspecified: Secondary | ICD-10-CM | POA: Diagnosis not present

## 2018-09-10 DIAGNOSIS — R0989 Other specified symptoms and signs involving the circulatory and respiratory systems: Secondary | ICD-10-CM | POA: Diagnosis not present

## 2018-09-17 DIAGNOSIS — E559 Vitamin D deficiency, unspecified: Secondary | ICD-10-CM | POA: Diagnosis not present

## 2018-09-17 DIAGNOSIS — Z79899 Other long term (current) drug therapy: Secondary | ICD-10-CM | POA: Diagnosis not present

## 2018-09-17 DIAGNOSIS — E039 Hypothyroidism, unspecified: Secondary | ICD-10-CM | POA: Diagnosis not present

## 2018-09-17 DIAGNOSIS — E7800 Pure hypercholesterolemia, unspecified: Secondary | ICD-10-CM | POA: Diagnosis not present

## 2018-09-17 DIAGNOSIS — I10 Essential (primary) hypertension: Secondary | ICD-10-CM | POA: Diagnosis not present

## 2018-09-20 DIAGNOSIS — I10 Essential (primary) hypertension: Secondary | ICD-10-CM | POA: Diagnosis not present

## 2018-09-20 DIAGNOSIS — E7800 Pure hypercholesterolemia, unspecified: Secondary | ICD-10-CM | POA: Diagnosis not present

## 2018-12-19 DIAGNOSIS — H60543 Acute eczematoid otitis externa, bilateral: Secondary | ICD-10-CM | POA: Diagnosis not present

## 2018-12-19 DIAGNOSIS — H6123 Impacted cerumen, bilateral: Secondary | ICD-10-CM | POA: Diagnosis not present

## 2018-12-31 ENCOUNTER — Ambulatory Visit: Payer: Medicare Other | Admitting: Psychiatry

## 2019-02-05 DIAGNOSIS — R140 Abdominal distension (gaseous): Secondary | ICD-10-CM | POA: Diagnosis not present

## 2019-02-05 DIAGNOSIS — R194 Change in bowel habit: Secondary | ICD-10-CM | POA: Diagnosis not present

## 2019-02-05 DIAGNOSIS — K5900 Constipation, unspecified: Secondary | ICD-10-CM | POA: Diagnosis not present

## 2019-02-05 DIAGNOSIS — I10 Essential (primary) hypertension: Secondary | ICD-10-CM | POA: Diagnosis not present

## 2019-02-11 DIAGNOSIS — E039 Hypothyroidism, unspecified: Secondary | ICD-10-CM | POA: Diagnosis not present

## 2019-02-11 DIAGNOSIS — K5909 Other constipation: Secondary | ICD-10-CM | POA: Diagnosis not present

## 2019-02-11 DIAGNOSIS — E7800 Pure hypercholesterolemia, unspecified: Secondary | ICD-10-CM | POA: Diagnosis not present

## 2019-02-11 DIAGNOSIS — I10 Essential (primary) hypertension: Secondary | ICD-10-CM | POA: Diagnosis not present

## 2019-02-11 DIAGNOSIS — R194 Change in bowel habit: Secondary | ICD-10-CM | POA: Diagnosis not present

## 2019-02-25 DIAGNOSIS — Z13820 Encounter for screening for osteoporosis: Secondary | ICD-10-CM | POA: Diagnosis not present

## 2019-02-25 DIAGNOSIS — Z79899 Other long term (current) drug therapy: Secondary | ICD-10-CM | POA: Diagnosis not present

## 2019-02-25 DIAGNOSIS — Z1239 Encounter for other screening for malignant neoplasm of breast: Secondary | ICD-10-CM | POA: Diagnosis not present

## 2019-02-25 DIAGNOSIS — I10 Essential (primary) hypertension: Secondary | ICD-10-CM | POA: Diagnosis not present

## 2019-02-25 DIAGNOSIS — E039 Hypothyroidism, unspecified: Secondary | ICD-10-CM | POA: Diagnosis not present

## 2019-02-25 DIAGNOSIS — E7800 Pure hypercholesterolemia, unspecified: Secondary | ICD-10-CM | POA: Diagnosis not present

## 2019-03-05 ENCOUNTER — Other Ambulatory Visit: Payer: Self-pay | Admitting: Internal Medicine

## 2019-03-05 DIAGNOSIS — E039 Hypothyroidism, unspecified: Secondary | ICD-10-CM | POA: Diagnosis not present

## 2019-03-05 DIAGNOSIS — I10 Essential (primary) hypertension: Secondary | ICD-10-CM | POA: Diagnosis not present

## 2019-03-05 DIAGNOSIS — Z1231 Encounter for screening mammogram for malignant neoplasm of breast: Secondary | ICD-10-CM

## 2019-03-05 DIAGNOSIS — Z79899 Other long term (current) drug therapy: Secondary | ICD-10-CM | POA: Diagnosis not present

## 2019-03-05 DIAGNOSIS — R8290 Unspecified abnormal findings in urine: Secondary | ICD-10-CM | POA: Diagnosis not present

## 2019-03-05 DIAGNOSIS — E7800 Pure hypercholesterolemia, unspecified: Secondary | ICD-10-CM | POA: Diagnosis not present

## 2019-03-17 DIAGNOSIS — K635 Polyp of colon: Secondary | ICD-10-CM | POA: Diagnosis not present

## 2019-03-17 DIAGNOSIS — I10 Essential (primary) hypertension: Secondary | ICD-10-CM | POA: Diagnosis not present

## 2019-03-17 DIAGNOSIS — R194 Change in bowel habit: Secondary | ICD-10-CM | POA: Diagnosis not present

## 2019-03-17 DIAGNOSIS — K5730 Diverticulosis of large intestine without perforation or abscess without bleeding: Secondary | ICD-10-CM | POA: Diagnosis not present

## 2019-03-17 DIAGNOSIS — K641 Second degree hemorrhoids: Secondary | ICD-10-CM | POA: Diagnosis not present

## 2019-03-17 DIAGNOSIS — K5909 Other constipation: Secondary | ICD-10-CM | POA: Diagnosis not present

## 2019-03-24 DIAGNOSIS — M8588 Other specified disorders of bone density and structure, other site: Secondary | ICD-10-CM | POA: Diagnosis not present

## 2019-04-14 ENCOUNTER — Ambulatory Visit
Admission: RE | Admit: 2019-04-14 | Discharge: 2019-04-14 | Disposition: A | Discharge status: Home / Self Care | Payer: Medicare Other | Source: Ambulatory Visit | Attending: Internal Medicine | Admitting: Internal Medicine

## 2019-04-14 ENCOUNTER — Other Ambulatory Visit: Payer: Self-pay

## 2019-04-14 DIAGNOSIS — Z1231 Encounter for screening mammogram for malignant neoplasm of breast: Secondary | ICD-10-CM | POA: Diagnosis not present

## 2019-07-18 DIAGNOSIS — M79604 Pain in right leg: Secondary | ICD-10-CM | POA: Diagnosis not present

## 2019-07-18 DIAGNOSIS — M1611 Unilateral primary osteoarthritis, right hip: Secondary | ICD-10-CM | POA: Diagnosis not present

## 2019-07-18 DIAGNOSIS — Z87891 Personal history of nicotine dependence: Secondary | ICD-10-CM | POA: Diagnosis not present

## 2019-07-21 ENCOUNTER — Other Ambulatory Visit: Payer: Self-pay | Admitting: Internal Medicine

## 2019-07-21 DIAGNOSIS — M79604 Pain in right leg: Secondary | ICD-10-CM

## 2019-07-24 ENCOUNTER — Ambulatory Visit
Admission: RE | Admit: 2019-07-24 | Discharge: 2019-07-24 | Disposition: A | Discharge status: Home / Self Care | Payer: Medicare Other | Source: Ambulatory Visit | Attending: Internal Medicine | Admitting: Internal Medicine

## 2019-07-24 ENCOUNTER — Other Ambulatory Visit: Payer: Self-pay

## 2019-07-24 DIAGNOSIS — M79604 Pain in right leg: Secondary | ICD-10-CM | POA: Diagnosis not present

## 2019-07-24 DIAGNOSIS — M7989 Other specified soft tissue disorders: Secondary | ICD-10-CM | POA: Diagnosis not present

## 2019-07-25 DIAGNOSIS — Z87891 Personal history of nicotine dependence: Secondary | ICD-10-CM | POA: Diagnosis not present

## 2019-07-25 DIAGNOSIS — M1711 Unilateral primary osteoarthritis, right knee: Secondary | ICD-10-CM | POA: Diagnosis not present

## 2019-07-25 DIAGNOSIS — M25561 Pain in right knee: Secondary | ICD-10-CM | POA: Diagnosis not present

## 2019-07-29 DIAGNOSIS — Z23 Encounter for immunization: Secondary | ICD-10-CM | POA: Diagnosis not present

## 2019-07-29 DIAGNOSIS — E559 Vitamin D deficiency, unspecified: Secondary | ICD-10-CM | POA: Diagnosis not present

## 2019-07-29 DIAGNOSIS — Z87891 Personal history of nicotine dependence: Secondary | ICD-10-CM | POA: Diagnosis not present

## 2019-07-29 DIAGNOSIS — E7800 Pure hypercholesterolemia, unspecified: Secondary | ICD-10-CM | POA: Diagnosis not present

## 2019-07-29 DIAGNOSIS — Z79899 Other long term (current) drug therapy: Secondary | ICD-10-CM | POA: Diagnosis not present

## 2019-07-29 DIAGNOSIS — I10 Essential (primary) hypertension: Secondary | ICD-10-CM | POA: Diagnosis not present

## 2019-07-29 DIAGNOSIS — Z0000 Encounter for general adult medical examination without abnormal findings: Secondary | ICD-10-CM | POA: Diagnosis not present

## 2019-07-29 DIAGNOSIS — E039 Hypothyroidism, unspecified: Secondary | ICD-10-CM | POA: Diagnosis not present

## 2019-07-29 DIAGNOSIS — M79606 Pain in leg, unspecified: Secondary | ICD-10-CM | POA: Diagnosis not present

## 2019-07-30 DIAGNOSIS — M25561 Pain in right knee: Secondary | ICD-10-CM | POA: Diagnosis not present

## 2019-07-30 DIAGNOSIS — M25461 Effusion, right knee: Secondary | ICD-10-CM | POA: Diagnosis not present

## 2019-07-30 DIAGNOSIS — M1711 Unilateral primary osteoarthritis, right knee: Secondary | ICD-10-CM | POA: Diagnosis not present

## 2019-08-12 DIAGNOSIS — E7800 Pure hypercholesterolemia, unspecified: Secondary | ICD-10-CM | POA: Diagnosis not present

## 2019-08-12 DIAGNOSIS — E039 Hypothyroidism, unspecified: Secondary | ICD-10-CM | POA: Diagnosis not present

## 2019-08-12 DIAGNOSIS — I10 Essential (primary) hypertension: Secondary | ICD-10-CM | POA: Diagnosis not present

## 2019-08-12 DIAGNOSIS — R8290 Unspecified abnormal findings in urine: Secondary | ICD-10-CM | POA: Diagnosis not present

## 2019-08-12 DIAGNOSIS — Z79899 Other long term (current) drug therapy: Secondary | ICD-10-CM | POA: Diagnosis not present

## 2019-08-12 DIAGNOSIS — E559 Vitamin D deficiency, unspecified: Secondary | ICD-10-CM | POA: Diagnosis not present

## 2019-08-15 DIAGNOSIS — M1711 Unilateral primary osteoarthritis, right knee: Secondary | ICD-10-CM | POA: Diagnosis not present

## 2019-08-15 DIAGNOSIS — M25561 Pain in right knee: Secondary | ICD-10-CM | POA: Diagnosis not present

## 2019-08-15 DIAGNOSIS — M25461 Effusion, right knee: Secondary | ICD-10-CM | POA: Diagnosis not present

## 2019-08-26 ENCOUNTER — Other Ambulatory Visit: Payer: Self-pay | Admitting: Sports Medicine

## 2019-08-26 DIAGNOSIS — M1711 Unilateral primary osteoarthritis, right knee: Secondary | ICD-10-CM | POA: Diagnosis not present

## 2019-08-26 DIAGNOSIS — M25461 Effusion, right knee: Secondary | ICD-10-CM

## 2019-08-26 DIAGNOSIS — M25561 Pain in right knee: Secondary | ICD-10-CM

## 2019-08-28 ENCOUNTER — Telehealth: Payer: Self-pay | Admitting: *Deleted

## 2019-09-03 ENCOUNTER — Ambulatory Visit
Admission: RE | Admit: 2019-09-03 | Discharge: 2019-09-03 | Disposition: A | Discharge status: Home / Self Care | Payer: Medicare Other | Source: Ambulatory Visit | Attending: Sports Medicine | Admitting: Sports Medicine

## 2019-09-03 ENCOUNTER — Other Ambulatory Visit: Payer: Self-pay

## 2019-09-03 DIAGNOSIS — M25461 Effusion, right knee: Secondary | ICD-10-CM | POA: Diagnosis not present

## 2019-09-03 DIAGNOSIS — M23221 Derangement of posterior horn of medial meniscus due to old tear or injury, right knee: Secondary | ICD-10-CM | POA: Diagnosis not present

## 2019-09-03 DIAGNOSIS — M25561 Pain in right knee: Secondary | ICD-10-CM | POA: Insufficient documentation

## 2019-09-03 DIAGNOSIS — M1711 Unilateral primary osteoarthritis, right knee: Secondary | ICD-10-CM | POA: Diagnosis not present

## 2019-09-09 DIAGNOSIS — M25461 Effusion, right knee: Secondary | ICD-10-CM | POA: Diagnosis not present

## 2019-09-09 DIAGNOSIS — M25561 Pain in right knee: Secondary | ICD-10-CM | POA: Diagnosis not present

## 2019-09-09 DIAGNOSIS — M7051 Other bursitis of knee, right knee: Secondary | ICD-10-CM | POA: Diagnosis not present

## 2019-09-09 DIAGNOSIS — M1711 Unilateral primary osteoarthritis, right knee: Secondary | ICD-10-CM | POA: Diagnosis not present

## 2019-09-09 DIAGNOSIS — S83241D Other tear of medial meniscus, current injury, right knee, subsequent encounter: Secondary | ICD-10-CM | POA: Diagnosis not present

## 2019-09-13 ENCOUNTER — Ambulatory Visit: Payer: Medicare Other

## 2019-09-17 DIAGNOSIS — M25561 Pain in right knee: Secondary | ICD-10-CM | POA: Diagnosis not present

## 2019-09-17 DIAGNOSIS — M25461 Effusion, right knee: Secondary | ICD-10-CM | POA: Diagnosis not present

## 2019-09-17 DIAGNOSIS — G8929 Other chronic pain: Secondary | ICD-10-CM | POA: Diagnosis not present

## 2019-09-18 DIAGNOSIS — G8929 Other chronic pain: Secondary | ICD-10-CM | POA: Diagnosis not present

## 2019-09-18 DIAGNOSIS — M25661 Stiffness of right knee, not elsewhere classified: Secondary | ICD-10-CM | POA: Diagnosis not present

## 2019-09-18 DIAGNOSIS — M25561 Pain in right knee: Secondary | ICD-10-CM | POA: Diagnosis not present

## 2019-09-18 DIAGNOSIS — M6281 Muscle weakness (generalized): Secondary | ICD-10-CM | POA: Diagnosis not present

## 2019-09-24 DIAGNOSIS — M25461 Effusion, right knee: Secondary | ICD-10-CM | POA: Diagnosis not present

## 2019-09-24 DIAGNOSIS — S83241D Other tear of medial meniscus, current injury, right knee, subsequent encounter: Secondary | ICD-10-CM | POA: Diagnosis not present

## 2019-09-24 DIAGNOSIS — M25561 Pain in right knee: Secondary | ICD-10-CM | POA: Diagnosis not present

## 2019-09-24 DIAGNOSIS — M1711 Unilateral primary osteoarthritis, right knee: Secondary | ICD-10-CM | POA: Diagnosis not present

## 2019-09-24 DIAGNOSIS — G8929 Other chronic pain: Secondary | ICD-10-CM | POA: Diagnosis not present

## 2020-02-18 ENCOUNTER — Other Ambulatory Visit: Payer: Self-pay | Admitting: Internal Medicine

## 2020-02-18 DIAGNOSIS — Z1231 Encounter for screening mammogram for malignant neoplasm of breast: Secondary | ICD-10-CM

## 2020-04-14 ENCOUNTER — Ambulatory Visit
Admission: RE | Admit: 2020-04-14 | Discharge: 2020-04-14 | Disposition: A | Discharge status: Home / Self Care | Payer: Medicare Other | Source: Ambulatory Visit | Attending: Internal Medicine | Admitting: Internal Medicine

## 2020-04-14 DIAGNOSIS — Z1231 Encounter for screening mammogram for malignant neoplasm of breast: Secondary | ICD-10-CM | POA: Diagnosis present

## 2020-08-27 ENCOUNTER — Other Ambulatory Visit: Payer: Self-pay | Admitting: Internal Medicine

## 2020-08-27 ENCOUNTER — Other Ambulatory Visit (HOSPITAL_COMMUNITY): Payer: Self-pay | Admitting: Internal Medicine

## 2020-08-27 DIAGNOSIS — R3121 Asymptomatic microscopic hematuria: Secondary | ICD-10-CM

## 2020-08-27 DIAGNOSIS — N1831 Chronic kidney disease, stage 3a: Secondary | ICD-10-CM

## 2020-09-03 ENCOUNTER — Ambulatory Visit
Admission: RE | Admit: 2020-09-03 | Discharge: 2020-09-03 | Disposition: A | Discharge status: Home / Self Care | Payer: Medicare Other | Source: Ambulatory Visit | Attending: Internal Medicine | Admitting: Internal Medicine

## 2020-09-03 ENCOUNTER — Other Ambulatory Visit: Payer: Self-pay

## 2020-09-03 DIAGNOSIS — R3121 Asymptomatic microscopic hematuria: Secondary | ICD-10-CM | POA: Insufficient documentation

## 2020-09-03 DIAGNOSIS — N1831 Chronic kidney disease, stage 3a: Secondary | ICD-10-CM | POA: Diagnosis present

## 2021-02-24 ENCOUNTER — Other Ambulatory Visit: Payer: Self-pay | Admitting: Internal Medicine

## 2021-02-24 DIAGNOSIS — Z1231 Encounter for screening mammogram for malignant neoplasm of breast: Secondary | ICD-10-CM

## 2021-04-27 ENCOUNTER — Ambulatory Visit
Admission: RE | Admit: 2021-04-27 | Discharge: 2021-04-27 | Disposition: A | Discharge status: Home / Self Care | Payer: Medicare Other | Source: Ambulatory Visit | Attending: Internal Medicine | Admitting: Internal Medicine

## 2021-04-27 ENCOUNTER — Other Ambulatory Visit: Payer: Self-pay

## 2021-04-27 DIAGNOSIS — Z1231 Encounter for screening mammogram for malignant neoplasm of breast: Secondary | ICD-10-CM | POA: Insufficient documentation

## 2022-03-21 ENCOUNTER — Other Ambulatory Visit: Payer: Self-pay | Admitting: Internal Medicine

## 2022-03-21 DIAGNOSIS — Z1231 Encounter for screening mammogram for malignant neoplasm of breast: Secondary | ICD-10-CM

## 2022-04-28 ENCOUNTER — Ambulatory Visit
Admission: RE | Admit: 2022-04-28 | Discharge: 2022-04-28 | Disposition: A | Discharge status: Home / Self Care | Payer: Medicare Other | Source: Ambulatory Visit | Attending: Internal Medicine | Admitting: Internal Medicine

## 2022-04-28 DIAGNOSIS — Z1231 Encounter for screening mammogram for malignant neoplasm of breast: Secondary | ICD-10-CM | POA: Diagnosis not present

## 2023-03-06 ENCOUNTER — Other Ambulatory Visit: Payer: Self-pay | Admitting: Internal Medicine

## 2023-03-06 DIAGNOSIS — Z1231 Encounter for screening mammogram for malignant neoplasm of breast: Secondary | ICD-10-CM

## 2023-04-30 ENCOUNTER — Ambulatory Visit
Admission: RE | Admit: 2023-04-30 | Discharge: 2023-04-30 | Disposition: A | Discharge status: Home / Self Care | Payer: Medicare Other | Source: Ambulatory Visit | Attending: Internal Medicine | Admitting: Internal Medicine

## 2023-04-30 DIAGNOSIS — Z1231 Encounter for screening mammogram for malignant neoplasm of breast: Secondary | ICD-10-CM | POA: Diagnosis present

## 2023-12-18 ENCOUNTER — Other Ambulatory Visit: Payer: Self-pay | Admitting: Internal Medicine

## 2023-12-18 DIAGNOSIS — I10 Essential (primary) hypertension: Secondary | ICD-10-CM

## 2023-12-18 DIAGNOSIS — R413 Other amnesia: Secondary | ICD-10-CM

## 2023-12-20 ENCOUNTER — Other Ambulatory Visit: Payer: Self-pay | Admitting: Internal Medicine

## 2023-12-20 DIAGNOSIS — R413 Other amnesia: Secondary | ICD-10-CM

## 2023-12-20 DIAGNOSIS — I10 Essential (primary) hypertension: Secondary | ICD-10-CM

## 2023-12-31 ENCOUNTER — Ambulatory Visit
Admission: RE | Admit: 2023-12-31 | Discharge: 2023-12-31 | Disposition: A | Discharge status: Home / Self Care | Source: Ambulatory Visit | Attending: Internal Medicine | Admitting: Internal Medicine

## 2023-12-31 DIAGNOSIS — R413 Other amnesia: Secondary | ICD-10-CM

## 2023-12-31 DIAGNOSIS — I10 Essential (primary) hypertension: Secondary | ICD-10-CM

## 2024-03-31 ENCOUNTER — Other Ambulatory Visit: Payer: Self-pay | Admitting: Internal Medicine

## 2024-03-31 DIAGNOSIS — Z1231 Encounter for screening mammogram for malignant neoplasm of breast: Secondary | ICD-10-CM

## 2024-04-30 ENCOUNTER — Ambulatory Visit
Admission: RE | Admit: 2024-04-30 | Discharge: 2024-04-30 | Disposition: A | Discharge status: Home / Self Care | Source: Ambulatory Visit | Attending: Internal Medicine | Admitting: Internal Medicine

## 2024-04-30 DIAGNOSIS — Z1231 Encounter for screening mammogram for malignant neoplasm of breast: Secondary | ICD-10-CM | POA: Diagnosis present
